# Patient Record
Sex: Male | Born: 1955 | Race: White | Hispanic: No | Marital: Married | Smoking: Former smoker
Health system: Southern US, Community
[De-identification: ages and names within clinical notes are randomized; demographics above are authoritative.]

## PROBLEM LIST (undated history)

## (undated) DIAGNOSIS — N182 Chronic kidney disease, stage 2 (mild): Secondary | ICD-10-CM

## (undated) DIAGNOSIS — Z860101 Personal history of adenomatous and serrated colon polyps: Secondary | ICD-10-CM

## (undated) DIAGNOSIS — E7800 Pure hypercholesterolemia, unspecified: Secondary | ICD-10-CM

## (undated) DIAGNOSIS — Z86010 Personal history of colonic polyps: Secondary | ICD-10-CM

## (undated) DIAGNOSIS — N2889 Other specified disorders of kidney and ureter: Secondary | ICD-10-CM

## (undated) DIAGNOSIS — M4802 Spinal stenosis, cervical region: Secondary | ICD-10-CM

## (undated) DIAGNOSIS — G5600 Carpal tunnel syndrome, unspecified upper limb: Secondary | ICD-10-CM

## (undated) DIAGNOSIS — K5909 Other constipation: Secondary | ICD-10-CM

## (undated) HISTORY — DX: Carpal tunnel syndrome, unspecified upper limb: G56.00

## (undated) HISTORY — PX: HAND SURGERY: SHX662

## (undated) HISTORY — DX: Spinal stenosis, cervical region: M48.02

## (undated) HISTORY — PX: COLONOSCOPY: SHX174

## (undated) HISTORY — DX: Personal history of adenomatous and serrated colon polyps: Z86.0101

## (undated) HISTORY — DX: Other constipation: K59.09

## (undated) HISTORY — PX: DENTAL SURGERY: SHX609

## (undated) HISTORY — DX: Chronic kidney disease, stage 2 (mild): N18.2

## (undated) HISTORY — DX: Other specified disorders of kidney and ureter: N28.89

## (undated) HISTORY — DX: Personal history of colonic polyps: Z86.010

---

## 2006-01-18 ENCOUNTER — Ambulatory Visit: Payer: Self-pay | Admitting: Internal Medicine

## 2007-06-28 ENCOUNTER — Telehealth (INDEPENDENT_AMBULATORY_CARE_PROVIDER_SITE_OTHER): Payer: Self-pay | Admitting: *Deleted

## 2007-06-29 ENCOUNTER — Ambulatory Visit: Payer: Self-pay | Admitting: Internal Medicine

## 2007-07-05 ENCOUNTER — Telehealth (INDEPENDENT_AMBULATORY_CARE_PROVIDER_SITE_OTHER): Payer: Self-pay | Admitting: *Deleted

## 2008-11-27 ENCOUNTER — Ambulatory Visit: Payer: Self-pay | Admitting: Internal Medicine

## 2008-11-27 DIAGNOSIS — E785 Hyperlipidemia, unspecified: Secondary | ICD-10-CM

## 2008-11-27 DIAGNOSIS — D485 Neoplasm of uncertain behavior of skin: Secondary | ICD-10-CM

## 2008-12-03 ENCOUNTER — Telehealth (INDEPENDENT_AMBULATORY_CARE_PROVIDER_SITE_OTHER): Payer: Self-pay | Admitting: *Deleted

## 2008-12-03 LAB — CONVERTED CEMR LAB
ALT: 28 units/L (ref 0–53)
Basophils Absolute: 0 10*3/uL (ref 0.0–0.1)
Chloride: 110 meq/L (ref 96–112)
Eosinophils Absolute: 0.1 10*3/uL (ref 0.0–0.7)
Glucose, Bld: 176 mg/dL — ABNORMAL HIGH (ref 70–99)
Hgb A1c MFr Bld: 7.4 % — ABNORMAL HIGH (ref 4.6–6.5)
Lymphocytes Relative: 20.6 % (ref 12.0–46.0)
MCV: 86.6 fL (ref 78.0–100.0)
Microalb Creat Ratio: 3.5 mg/g (ref 0.0–30.0)
Microalb, Ur: 1.1 mg/dL (ref 0.0–1.9)
Monocytes Absolute: 0.4 10*3/uL (ref 0.1–1.0)
PSA: 0.44 ng/mL (ref 0.10–4.00)
Platelets: 268 10*3/uL (ref 150.0–400.0)
RBC: 5.12 M/uL (ref 4.22–5.81)
RDW: 12.4 % (ref 11.5–14.6)
TSH: 1.95 microintl units/mL (ref 0.35–5.50)
Total Protein: 6.9 g/dL (ref 6.0–8.3)
WBC: 6.4 10*3/uL (ref 4.5–10.5)

## 2008-12-10 ENCOUNTER — Ambulatory Visit: Payer: Self-pay | Admitting: Internal Medicine

## 2008-12-10 DIAGNOSIS — E1165 Type 2 diabetes mellitus with hyperglycemia: Secondary | ICD-10-CM

## 2008-12-19 ENCOUNTER — Encounter: Payer: Self-pay | Admitting: Internal Medicine

## 2009-03-06 ENCOUNTER — Ambulatory Visit: Payer: Self-pay | Admitting: Internal Medicine

## 2009-03-18 ENCOUNTER — Encounter (INDEPENDENT_AMBULATORY_CARE_PROVIDER_SITE_OTHER): Payer: Self-pay | Admitting: *Deleted

## 2009-03-18 LAB — CONVERTED CEMR LAB
AST: 25 units/L (ref 0–37)
Albumin: 3.8 g/dL (ref 3.5–5.2)
Alkaline Phosphatase: 44 units/L (ref 39–117)
Hgb A1c MFr Bld: 6.5 % (ref 4.6–6.5)
Total CHOL/HDL Ratio: 5
Total Protein: 6.7 g/dL (ref 6.0–8.3)
VLDL: 35.2 mg/dL (ref 0.0–40.0)

## 2009-07-07 ENCOUNTER — Ambulatory Visit: Payer: Self-pay | Admitting: Internal Medicine

## 2009-07-07 DIAGNOSIS — R21 Rash and other nonspecific skin eruption: Secondary | ICD-10-CM

## 2010-04-02 ENCOUNTER — Ambulatory Visit: Payer: Self-pay | Admitting: Internal Medicine

## 2010-04-10 LAB — CONVERTED CEMR LAB
Alkaline Phosphatase: 39 units/L (ref 39–117)
Cholesterol: 183 mg/dL (ref 0–200)
Direct LDL: 99.7 mg/dL
Hgb A1c MFr Bld: 8.5 % — ABNORMAL HIGH (ref 4.6–6.5)
Microalb, Ur: 6.2 mg/dL — ABNORMAL HIGH (ref 0.0–1.9)

## 2010-05-19 ENCOUNTER — Ambulatory Visit: Payer: Self-pay | Admitting: Internal Medicine

## 2010-10-06 NOTE — Assessment & Plan Note (Signed)
Summary: FOLLOW UP PER LETTER/RH......   Vital Signs:  Patient profile:   55 year old male Height:      73.5 inches Weight:      240 pounds Temp:     98.0 degrees F oral Pulse rate:   80 / minute Resp:     17 per minute BP sitting:   110 / 90  (left arm)  Vitals Entered By: Jeremy Johann CMA (May 19, 2010 3:32 PM)  CC:  Type 2 diabetes mellitus follow-up.  History of Present Illness: Type 2 Diabetes Mellitus Follow-Up      This is a 55 year old man who presents for Type 2 diabetes mellitus follow-up.  The patient denies polyuria, polydipsia, blurred vision, self managed hypoglycemia, weight loss, weight gain, and numbness of extremities.  The patient denies the following symptoms: neuropathic pain, chest pain, vomiting, orthostatic symptoms, poor wound healing, intermittent claudication, vision loss, and foot ulcer.  Since the last visit the patient reports poor to fair  dietary compliance, noncompliance with medications ( he had had run out & didn't restart meds), exercising regularly, and not monitoring blood glucose.  Since the last visit, the patient reports having had no  eye care by an ophthalmologist ( appt later this month) and no foot care.  A1c was 11.5% on life insurance exam in 01/2010 ; that prompted him to restart meds.Risks  related to A1c level & role of HFCS in raising TG discussed. Hyperlipidemia Follow-Up      The patient also presents for Hyperlipidemia follow-up.  The patient reports chronic  constipation, but denies muscle aches, GI upset, abdominal pain, flushing, itching, diarrhea, and fatigue.  The patient denies the following symptoms: chest pain/pressure, exercise intolerance, dypsnea, palpitations, syncope, and pedal edema.  Compliance with medications (by patient report) has been near 100%.  Adjunctive measures currently used by the patient include ASA.  LDL is @ goal .  Current Medications (verified): 1)  Metformin Hcl 500 Mg Xr24h-Tab (Metformin Hcl)  .Marland Kitchen.. 1 Two Times A Day With Meals 2)  Pravachol 40 Mg Tabs (Pravastatin Sodium) .Marland Kitchen.. 1 At Bedtime  Allergies (verified): No Known Drug Allergies  Physical Exam  General:  well-nourished,in no acute distress; alert,appropriate and cooperative throughout examination Lungs:  Normal respiratory effort, chest expands symmetrically. Lungs are clear to auscultation, no crackles or wheezes. Heart:  Normal rate and regular rhythm. S1 and S2 normal without gallop, murmur, click, rub S4 Pulses:  R and L carotid,radial  and posterior tibial pulses are full and equal bilaterally. Decreased DPP  Extremities:  No clubbing, cyanosis, edema, or deformity noted . Good nail health Neurologic:  alert & oriented X3 and sensation intact to light touch over feet   Skin:  Intact without suspicious lesions or rashes Psych:  memory intact for recent and remote, normally interactive, and good eye contact.     Impression & Recommendations:  Problem # 1:  DIAB W/O MENTION COMP TYPE II/UNS TYPE UNCNTRL (ICD-250.02)  The following medications were removed from the medication list:    Metformin Hcl 500 Mg Xr24h-tab (Metformin hcl) .Marland Kitchen... 1 two times a day with meals His updated medication list for this problem includes:    Janumet 50-1000 Mg Tabs (Sitagliptin-metformin hcl) .Marland Kitchen... 1 two times a day with 2 largest meals  Problem # 2:  HYPERLIPIDEMIA (ICD-272.4)  His updated medication list for this problem includes:    Pravachol 40 Mg Tabs (Pravastatin sodium) .Marland Kitchen... 1 at bedtime  Complete Medication List: 1)  Pravachol 40 Mg Tabs (Pravastatin sodium) .Marland Kitchen.. 1 at bedtime 2)  Janumet 50-1000 Mg Tabs (Sitagliptin-metformin hcl) .Marland Kitchen.. 1 two times a day with 2 largest meals  Patient Instructions: 1)  Consume < 40 grams of HFCS sugar / day. 2)  Please schedule a follow-up appointment in 3 months. 3)  BUN,creat,K+ prior to visit, ICD-9:995.20 4)  Lipid Panel prior to visit, ICD-9:272.4 5)  HbgA1C prior to visit,  ICD-9:250.02 6)  Urine Microalbumin prior to visit, ICD-9:250.02. 7)  Take an 81 mg coated Aspirin every day. 8)  Check your blood sugars regularly. Your goals are fasting sugar 90-150 & < 180 (ideally < 160) 2 hrs after largest meal. 9)  See your eye doctor yearly to check for diabetic eye damage. 10)  Check your feet each night for sore areas, calluses or signs of infection. Prescriptions: JANUMET 50-1000 MG TABS (SITAGLIPTIN-METFORMIN HCL) 1 two times a day with 2 largest meals  #60 x 3   Entered and Authorized by:   Marga Melnick MD   Signed by:   Marga Melnick MD on 05/19/2010   Method used:   Print then Give to Patient   RxID:   [id number] PRAVACHOL 40 MG TABS (PRAVASTATIN SODIUM) 1 at bedtime  #90 x 3   Entered and Authorized by:   Marga Melnick MD   Signed by:   Marga Melnick MD on 05/19/2010   Method used:   Print then Give to Patient   RxID:   [id number]

## 2010-10-23 ENCOUNTER — Telehealth (INDEPENDENT_AMBULATORY_CARE_PROVIDER_SITE_OTHER): Payer: Self-pay | Admitting: *Deleted

## 2010-10-23 ENCOUNTER — Ambulatory Visit (INDEPENDENT_AMBULATORY_CARE_PROVIDER_SITE_OTHER): Payer: No Typology Code available for payment source | Admitting: Internal Medicine

## 2010-10-23 ENCOUNTER — Encounter: Payer: Self-pay | Admitting: Internal Medicine

## 2010-10-23 ENCOUNTER — Other Ambulatory Visit: Payer: Self-pay | Admitting: Internal Medicine

## 2010-10-23 DIAGNOSIS — E785 Hyperlipidemia, unspecified: Secondary | ICD-10-CM

## 2010-10-23 DIAGNOSIS — E1165 Type 2 diabetes mellitus with hyperglycemia: Secondary | ICD-10-CM

## 2010-10-23 DIAGNOSIS — IMO0001 Reserved for inherently not codable concepts without codable children: Secondary | ICD-10-CM

## 2010-10-23 LAB — HEMOGLOBIN A1C: Hgb A1c MFr Bld: 8.1 % — ABNORMAL HIGH (ref 4.6–6.5)

## 2010-10-23 LAB — CREATININE, SERUM: Creatinine, Ser: 1.2 mg/dL (ref 0.4–1.5)

## 2010-10-23 LAB — URIC ACID: Uric Acid, Serum: 6.2 mg/dL (ref 4.0–7.8)

## 2010-10-23 LAB — MICROALBUMIN / CREATININE URINE RATIO: Microalb Creat Ratio: 0.4 mg/g (ref 0.0–30.0)

## 2010-10-23 LAB — LIPID PANEL
Cholesterol: 202 mg/dL — ABNORMAL HIGH (ref 0–200)
HDL: 44.2 mg/dL (ref >39.00)

## 2010-10-23 LAB — BUN: BUN: 14 mg/dL (ref 6–23)

## 2010-10-28 NOTE — Progress Notes (Signed)
Summary: ? lancett  Phone Note Refill Request Call back at Home Phone [phone] [facility]   Message from:  Patient  patient has 1 touch ultra mini monitor -rx was sent in for lancet - he said [facility] [place] told him he would need a new monitor because they can"t order lancett for that monitor any more - he needs rx for monitor  Initial call taken by: Okey Regal [place],  October 23, 2010 1:31 PM  Follow-up for Phone Call        I spoke with the patient and he will come by th office to get everything concerning his blood glucose machine in order  Follow-up by: Shonna Chock CMA,  October 23, 2010 3:39 PM  Additional Follow-up for Phone Call Additional follow up Details #1::        Patient stopped by and picked up new meter Additional Follow-up by: Shonna Chock CMA,  October 23, 2010 4:58 PM

## 2010-10-28 NOTE — Assessment & Plan Note (Signed)
Summary: discuss blood sugar/cbs   Vital Signs:  Patient profile:   55 year old male Weight:      241.4 pounds BMI:     31.53 Temp:     98.4 degrees F oral Pulse rate:   84 / minute Resp:     14 per minute BP sitting:   124 / 86  (left arm) Cuff size:   large  Vitals Entered By: Shonna Chock CMA (October 23, 2010 10:21 AM) CC: Discuss Bloodsugars, Type 2 diabetes mellitus follow-up   CC:  Discuss Bloodsugars and Type 2 diabetes mellitus follow-up.  History of Present Illness:    Glenn Arnold has noted  glucoses 200 one hr post lunch. He  reports blurred vision (glucose was 300), but denies polyuria, polydipsia, weight loss, weight gain, and numbness of extremities.  The patient denies the following symptoms: neuropathic pain, chest pain, vomiting, orthostatic symptoms, poor wound healing, intermittent claudication, vision loss, and foot ulcer.  Since the last visit the patient reports fair  dietary compliance and exercising regularly.  A1c was 8.5% ( average sugar 197 & risk 70% increased) & TG 282 in 03/2010.  Current Medications (verified): 1)  Pravachol 40 Mg Tabs (Pravastatin Sodium) .Marland Kitchen.. 1 At Bedtime 2)  Janumet 50-1000 Mg Tabs (Sitagliptin-Metformin Hcl) .Marland Kitchen.. 1 Two Times A Day With 2 Largest Meals  Allergies (verified): No Known Drug Allergies  Physical Exam  General:  in no acute distress; alert,appropriate and cooperative throughout examination Heart:  Normal rate and regular rhythm. S1 and S2 normal without gallop, murmur, click, rub .S4 Pulses:  R and L carotid,radial,dorsalis pedis and posterior tibial pulses are full and equal bilaterally Extremities:  No clubbing, cyanosis, edema. Nails trimmedclosely Neurologic:  alert & oriented X3 and sensation intact to light touch over feet.   Cervical Nodes:  No lymphadenopathy noted Psych:  normally interactive focused, good eye contact.     Impression & Recommendations:  Problem # 1:  DIAB W/O MENTION COMP TYPE II/UNS  TYPE UNCNTRL (ICD-250.02)  His updated medication list for this problem includes:    Janumet 50-1000 Mg Tabs (Sitagliptin-metformin hcl) .Marland Kitchen... 1 two times a day with 2 largest meals  Orders: Venipuncture (94854) TLB-Creatinine, Blood (82565-CREA) TLB-Potassium (K+) (84132-K) TLB-Uric Acid, Blood (84550-URIC) TLB-BUN (Urea Nitrogen) (84520-BUN) TLB-A1C / Hgb A1C (Glycohemoglobin) (83036-A1C) TLB-Microalbumin/Creat Ratio, Urine (82043-MALB)  Problem # 2:  HYPERLIPIDEMIA (ICD-272.4) Assessment: Unchanged  His updated medication list for this problem includes:    Pravachol 40 Mg Tabs (Pravastatin sodium) .Marland Kitchen... 1 at bedtime  Orders: Venipuncture (62703) TLB-Lipid Panel (80061-LIPID) TLB-Uric Acid, Blood (84550-URIC)  Complete Medication List: 1)  Pravachol 40 Mg Tabs (Pravastatin sodium) .Marland Kitchen.. 1 at bedtime 2)  Janumet 50-1000 Mg Tabs (Sitagliptin-metformin hcl) .Marland Kitchen.. 1 two times a day with 2 largest meals  Patient Instructions: 1)  Follow The New Sugar [employer]usters; avoid HFCS sugar as discussed. 2)  Check your blood sugars regularly. If your am , fasting  readings are usually above :150  or below 90  OR glucose  TWO hrs after largest meal > 180 you should contact our office. 3)  See your eye doctor yearly to check for diabetic eye damage. 4)  Check your feet each night for sore areas, calluses or signs of infection.   Orders Added: 1)  Est. Patient Level III [50093] 2)  Venipuncture [81829] 3)  TLB-Lipid Panel [80061-LIPID] 4)  TLB-Creatinine, Blood [82565-CREA] 5)  TLB-Potassium (K+) [84132-K] 6)  TLB-Uric Acid, Blood [84550-URIC] 7)  TLB-BUN (Urea Nitrogen) [  84520-BUN] 8)  TLB-A1C / Hgb A1C (Glycohemoglobin) [83036-A1C] 9)  TLB-Microalbumin/Creat Ratio, Urine [82043-MALB]  Appended Document: discuss blood sugar/cbs Prescriptions: ONETOUCH DELICA LANCETS  MISC (LANCETS) check bloodsugars once daily  #100 x 3   Entered by:   Shonna Chock CMA   Authorized by:   Marga Melnick  MD   Signed by:   Shonna Chock CMA on 10/23/2010   Method used:   Electronically to        [facility] [address] (retail)       [phone]       [place], [place]  [address]       Ph: [phone]       Fax: [phone]   RxID:   [id number] ONETOUCH ULTRA BLUE  STRP (GLUCOSE BLOOD) check bloodsugar once daily  #100 x 3   Entered by:   Shonna Chock CMA   Authorized by:   Marga Melnick MD   Signed by:   Shonna Chock CMA on 10/23/2010   Method used:   Electronically to        [facility] [address] (retail)       [phone]       [place], [place]  [address]       Ph: [phone]       Fax: [phone]   RxID:   [id number]

## 2011-01-20 ENCOUNTER — Telehealth: Payer: Self-pay | Admitting: Internal Medicine

## 2011-01-20 NOTE — Telephone Encounter (Signed)
Patient has lab appt for 02/02/2011  ---I found these orders:  Lipid ,Creatinine,Potassium (K+) ,Uric Acid, BUN, Hgb A1C, (Glycohemoglobin), Microalbumin/Creat Ratio, Urine ---what codes do you want??    Any additions or corrections??

## 2011-01-21 NOTE — Telephone Encounter (Signed)
Codes: 272.4, 790.29, 790.6.

## 2011-01-21 NOTE — Telephone Encounter (Signed)
Added info to labs for 5/29

## 2011-01-22 NOTE — Assessment & Plan Note (Signed)
[facility]                                 ON-CALL NOTE   NAME:SHOCKLEYBubber, Rothert                       MRN:          [id number]  DATE:07/05/2007                            DOB:          August 28, 1956    TIME OF CALL:  6:34 p.m.   The caller is Leann as [facility].  We do not know who his  primary care physician is.   TELEPHONE NUMBER:  [phone].   The pharmacist received a fax from the [facility] Triage Nurse  about 5 o'clock this evening for 5 days of Avelox 400 mg to take once  daily, also promethazine with codeine syrup to use as needed.  She can  not read the name of the person who ordered this on the fax and calls me  to ask if I can okay it.  My response is no, I can not okay this  prescription without knowing what is going on.  I advised the pharmacist  to contact the [facility] office first thing tomorrow morning to  get this clarified.     Tera Mater. Clent Ridges, MD  Electronically Signed    SAF/MedQ  DD: 07/05/2007  DT: 07/06/2007  Job #: [id number]

## 2011-01-22 NOTE — Assessment & Plan Note (Signed)
[facility]                                 ON-CALL NOTE   NAME:SHOCKLEYAmdrew, Oboyle                       MRN:          [id number]  DATE:07/05/2007                            DOB:          1956/01/22    Time received is 6:09 p.m.  The patient is Glenn Arnold.  The caller  is the same.  He sees Dr. Alwyn Ren.  Date of birth [birth year].  Telephone [phone].  The patient is calling with a question about  antibiotics.  He says he contacted Dr. Frederik Pear office this afternoon,  and was to have had an antibiotic called in today for a sinus infection.  He says the pharmacy now says that they never received any such call.  He wonders if I could help him out.  My response is no, he would need to  contact Dr. Alwyn Ren tomorrow morning.     Tera Mater. Clent Ridges, MD  Electronically Signed    SAF/MedQ  DD: 07/05/2007  DT: 07/06/2007  Job #: [id number]

## 2011-01-29 ENCOUNTER — Other Ambulatory Visit: Payer: Self-pay | Admitting: *Deleted

## 2011-01-29 DIAGNOSIS — R7989 Other specified abnormal findings of blood chemistry: Secondary | ICD-10-CM

## 2011-01-29 DIAGNOSIS — E785 Hyperlipidemia, unspecified: Secondary | ICD-10-CM

## 2011-01-29 DIAGNOSIS — R7309 Other abnormal glucose: Secondary | ICD-10-CM

## 2011-02-02 ENCOUNTER — Other Ambulatory Visit (INDEPENDENT_AMBULATORY_CARE_PROVIDER_SITE_OTHER): Payer: No Typology Code available for payment source

## 2011-02-02 ENCOUNTER — Other Ambulatory Visit (INDEPENDENT_AMBULATORY_CARE_PROVIDER_SITE_OTHER): Payer: No Typology Code available for payment source | Admitting: Internal Medicine

## 2011-02-02 DIAGNOSIS — Z13220 Encounter for screening for lipoid disorders: Secondary | ICD-10-CM

## 2011-02-02 DIAGNOSIS — E785 Hyperlipidemia, unspecified: Secondary | ICD-10-CM

## 2011-02-02 DIAGNOSIS — R7309 Other abnormal glucose: Secondary | ICD-10-CM

## 2011-02-02 DIAGNOSIS — R7989 Other specified abnormal findings of blood chemistry: Secondary | ICD-10-CM

## 2011-02-02 LAB — LDL CHOLESTEROL, DIRECT: Direct LDL: 107.1 mg/dL

## 2011-02-02 LAB — LIPID PANEL
HDL: 43.2 mg/dL (ref >39.00)
Total CHOL/HDL Ratio: 4
Triglycerides: 242 mg/dL — ABNORMAL HIGH (ref 0.0–149.0)
VLDL: 48.4 mg/dL — ABNORMAL HIGH (ref 0.0–40.0)

## 2011-02-02 LAB — CREATININE, SERUM: Creatinine, Ser: 1.2 mg/dL (ref 0.4–1.5)

## 2011-02-02 LAB — MICROALBUMIN / CREATININE URINE RATIO: Microalb Creat Ratio: 1.3 mg/g (ref 0.0–30.0)

## 2011-02-09 NOTE — Progress Notes (Signed)
Labs only

## 2011-02-11 ENCOUNTER — Telehealth: Payer: Self-pay | Admitting: Internal Medicine

## 2011-02-11 NOTE — Progress Notes (Signed)
LMOM to call and schedule followup appt in order to continue getting meds refilled

## 2011-02-12 ENCOUNTER — Encounter: Payer: Self-pay | Admitting: Internal Medicine

## 2011-02-12 ENCOUNTER — Ambulatory Visit (INDEPENDENT_AMBULATORY_CARE_PROVIDER_SITE_OTHER): Payer: No Typology Code available for payment source | Admitting: Internal Medicine

## 2011-02-12 DIAGNOSIS — E785 Hyperlipidemia, unspecified: Secondary | ICD-10-CM

## 2011-02-12 DIAGNOSIS — R030 Elevated blood-pressure reading, without diagnosis of hypertension: Secondary | ICD-10-CM | POA: Insufficient documentation

## 2011-02-12 MED ORDER — SIMVASTATIN 40 MG PO TABS: 40.0000 mg | ORAL_TABLET | Freq: Every day | ORAL | Status: DC

## 2011-02-12 MED ORDER — GLIMEPIRIDE 1 MG PO TABS: 1.0000 mg | ORAL_TABLET | Freq: Every day | ORAL | Status: DC

## 2011-02-12 MED ORDER — RAMIPRIL 2.5 MG PO CAPS: 2.5000 mg | ORAL_CAPSULE | Freq: Every day | ORAL | Status: DC

## 2011-02-12 MED ORDER — SITAGLIPTIN PHOS-METFORMIN HCL 50-1000 MG PO TABS: 1.0000 | ORAL_TABLET | Freq: Two times a day (BID) | ORAL | Status: DC

## 2011-02-12 NOTE — Patient Instructions (Signed)
Diabetes Monitor    The A1c test is used primarily to monitor the glucose control of diabetics over time. The goal of those with diabetes is to keep their blood glucose levels as close to normal as possible. This helps to minimize the complications caused by chronically elevated glucose levels, such as progressive damage to body organs like the kidneys, eyes, cardiovascular system, and nerves. The A1c test gives a picture of the average amount of glucose in the blood over the last few months. It can help a patient and his doctor know if the measures they are taking to control the patient's diabetes are successful or need to be adjusted.    NORMAL VALUES  Non diabetic adults: 5 %-6.1%  Good diabetic control: 6.2-6.4 %  Fair diabetic control: 6.5-7%  Poor diabetic control: greater than 7 % ( except with additional factors such as  advanced age; significant coronary or neurologic disease,etc). Check the A1c  every 4 months as it is  6.5% or higher. Goals for home glucose monitoring are : fasting  or morning glucose goal of  90-150. Two hours after any meal , goal = < 180, preferably < 150.  The most common cause of elevated triglycerides is the ingestion of sugar from high fructose corn syrup sources. You should consume less than 40 grams  of sugar per day from foods and drinks with high fructose corn syrup as number 2, 3, or #4 on the label. As TG go up, HDL or good cholesterol goes down. Also uric acid which causes gout will go up.

## 2011-02-12 NOTE — Progress Notes (Signed)
Subjective:    Patient ID: Glenn Arnold, male    DOB: 05-May-1956, 55 y.o.   MRN: [id number]  HPI Diabetes status assessment: Fasting or morning glucose range:  73-131 or average : in 120s  . Highest glucose 2 hours after any meal:  < 180. Hypoglycemia :  In 70s  Two hrs after meal .                                                     Excess thirst :no;  Excess hunger:  no ;  Excess urination:  no.                                  Lightheadedness with standing:  no. Chest pain:  no ; Palpitations :no ;  Pain in  calves with walking:  no .                                                                                                                                 Non healing skin  ulcers or sores,especially over the feet:  no Numbness or tingling or burning in feet : no .                                                                                                                                              Significant change in  Weight : down 5#. Vision changes : no  .                                                                    Exercise : every other walking 1 mpd . Nutrition/diet:  Eats out . Medication compliance : yes. Medication adverse  Effects:  no . Eye exam : up to date. Foot care : no.  A1c/ urine microalbumin monitor:  Results reviewed & risks discussed; DM  control improved       Review of Systems     Objective:   Physical Exam Gen.: Healthy and well-nourished in appearance. Alert, appropriate and cooperative throughout exam. Eyes: No corneal or conjunctival inflammation noted. Neck: No deformities, masses, or tenderness noted.  Thyroid  normal. Lungs: Normal respiratory effort; chest expands symmetrically. Lungs are clear to auscultation without rales, wheezes, or increased work of breathing. Heart: Normal rate and rhythm. Normal S1 and S2. No gallop, click, or rub. S4 with slurring; murmur. No clubbing, cyanosis, edema, or deformity noted.Nail health   good. Vascular: Carotid, radial artery, dorsalis pedis and dorsalis posterior tibial pulses are full and equal. No bruits present. Neurologic: Alert and oriented x3. Light touch normal over feet. Skin: Intact without suspicious lesions or rashes. Psych: Mood and affect are normal. Normally interactive                                                                                         Assessment & Plan:  #1 DM , control improved  #2 dyslipidemia Plan: no med change; major focus on restricting HFCS sugar ( discussed in detail)

## 2011-02-17 NOTE — Telephone Encounter (Signed)
Was seen on 6/8 to review labs

## 2011-04-12 ENCOUNTER — Encounter: Payer: Self-pay | Admitting: Internal Medicine

## 2011-04-12 ENCOUNTER — Ambulatory Visit (INDEPENDENT_AMBULATORY_CARE_PROVIDER_SITE_OTHER): Payer: No Typology Code available for payment source | Admitting: Internal Medicine

## 2011-04-12 DIAGNOSIS — K5900 Constipation, unspecified: Secondary | ICD-10-CM

## 2011-04-12 DIAGNOSIS — R109 Unspecified abdominal pain: Secondary | ICD-10-CM

## 2011-04-12 MED ORDER — POLYETHYLENE GLYCOL 3350 17 G PO PACK: 17.0000 g | PACK | Freq: Every day | ORAL | Status: AC

## 2011-04-12 NOTE — Patient Instructions (Signed)
Stay on clear liquids for 48-72 hours or until bowels are normal.This would include  jello, sherbert (NOT ice cream), Lipton's chicken noodle soup(NOT cream based soups),Gatorade Lite, flat Ginger ale (without High Fructose Corn Syrup),dry toast or crackers, baked potato.No milk , dairy or grease until bowels are formed. Align , a [employer] , daily if stools are loose.  Report increasing pain, fever or rectal bleeding .  Take the stool softener every day; MiraLax daily for 3 days if needed.

## 2011-04-12 NOTE — Progress Notes (Signed)
  Subjective:    Patient ID: Glenn Arnold, male    DOB: Jan 17, 1956, 55 y.o.   MRN: [id number]  HPI Constipation: Onset: 7/22    Time course: scant stool every other day   Description:loose   Previous treatment: yes, salads & fruit. Stool softener ; Fleet's enema twice a day X 3 days; Glycerin suppository X3 Vomiting: no  Abdominal Pain: yes, mid abdominal , aching to sharp  Weight loss: yes, 7#  Decreased urine output: no  Lightheadedness: yes, 2-3 days ago  Recent travel history: yes, [place] 7/18-7/27    Suspicious food or water: yes  Change in diet: yes  Red Flags Fever: no  Bloody stools: no, but rectal irritation PMH: He was hospitalized in 1995 for severe abdominal pain in the context of protracted constipation. He believes that Dulcolax and other stimulant laxatives had caused this problem. He was found to be dehydrated at that time.  FH: mother : intermittent constipation  His last colonoscopy in 2004 was normal. DM: FBS < 120; checked irregularly  The last thyroid function test on record was in 2010.    Review of Systems     Objective:   Physical Exam General appearance is one of good health and nourishment w/o distress.  Eyes: No conjunctival inflammation or scleral icterus is present. EOMI; no lid lag  Oral exam: Dental hygiene is good; lips and gums are healthy appearing.There is no oropharyngeal erythema or exudate noted.   Heart:  Normal rate and regular rhythm. S1 and S2 normal without gallop, murmur, click, rub or other extra sounds     Lungs:Chest clear to auscultation; no wheezes, rhonchi,rales ,or rubs present.No increased work of breathing.   Abdomen: bowel sounds normal, soft and  Slightly tender midabdomen without masses, organomegaly or hernias noted.  No guarding or rebound   Skin:Warm & dry.  Intact without suspicious lesions or rashes ; no jaundice or tenting Neuro:DTRs WNL; oriented X 3  Lymphatic: No lymphadenopathy is noted about the  head, neck, axilla, or inguinal areas.  Rectal: declined             Assessment & Plan:  #1 constipation, severe, in the context of foreign travel  #2 abdominal discomfort secondary to #1; clinically no ileus  #3 diabetes, good control based on limited fasting blood sugar readings.  Plan: See orders and recommendations.

## 2011-04-13 LAB — CBC WITH DIFFERENTIAL/PLATELET
Basophils Absolute: 0 10*3/uL (ref 0.0–0.1)
Basophils Relative: 0.4 % (ref 0.0–3.0)
Eosinophils Absolute: 0.1 10*3/uL (ref 0.0–0.7)
Hemoglobin: 15.5 g/dL (ref 13.0–17.0)
Lymphs Abs: 1.8 10*3/uL (ref 0.7–4.0)
MCHC: 33.4 g/dL (ref 30.0–36.0)
MCV: 87.2 fl (ref 78.0–100.0)
Monocytes Absolute: 0.5 10*3/uL (ref 0.1–1.0)
Neutro Abs: 3.9 10*3/uL (ref 1.4–7.7)
RBC: 5.32 Mil/uL (ref 4.22–5.81)
RDW: 13.6 % (ref 11.5–14.6)

## 2011-04-13 LAB — BASIC METABOLIC PANEL
BUN: 12 mg/dL (ref 6–23)
Chloride: 106 mEq/L (ref 96–112)
Creatinine, Ser: 1.1 mg/dL (ref 0.4–1.5)
Glucose, Bld: 138 mg/dL — ABNORMAL HIGH (ref 70–99)
Potassium: 5.5 mEq/L — ABNORMAL HIGH (ref 3.5–5.1)

## 2011-05-11 ENCOUNTER — Other Ambulatory Visit: Payer: Self-pay | Admitting: Internal Medicine

## 2011-09-07 DIAGNOSIS — G5600 Carpal tunnel syndrome, unspecified upper limb: Secondary | ICD-10-CM

## 2011-09-07 HISTORY — PX: OTHER SURGICAL HISTORY: SHX169

## 2011-09-07 HISTORY — DX: Carpal tunnel syndrome, unspecified upper limb: G56.00

## 2011-09-21 ENCOUNTER — Telehealth: Payer: Self-pay | Admitting: Internal Medicine

## 2011-09-21 MED ORDER — OSELTAMIVIR PHOSPHATE 75 MG PO CAPS: 75.0000 mg | ORAL_CAPSULE | Freq: Two times a day (BID) | ORAL | Status: AC

## 2011-09-21 NOTE — Telephone Encounter (Signed)
Per Dr hopper tamiflu 1 bid #10.Rx sent to pharmacy left Pt detail message.

## 2011-09-21 NOTE — Telephone Encounter (Signed)
Patient states that he is now sick with what his wife has. Dr. Alwyn Ren called in tamiflu for his wife. He wants to know if Dr. Alwyn Ren will call in Tamiflu for him. [facility] [address].

## 2011-09-29 ENCOUNTER — Other Ambulatory Visit: Payer: Self-pay | Admitting: Internal Medicine

## 2011-09-30 ENCOUNTER — Other Ambulatory Visit: Payer: Self-pay | Admitting: Internal Medicine

## 2011-09-30 NOTE — Telephone Encounter (Signed)
A1C 250.00 

## 2011-10-02 ENCOUNTER — Other Ambulatory Visit: Payer: Self-pay | Admitting: Internal Medicine

## 2011-11-08 ENCOUNTER — Other Ambulatory Visit: Payer: Self-pay | Admitting: Internal Medicine

## 2011-11-08 NOTE — Telephone Encounter (Signed)
Prescription sent to pharmacy.

## 2011-11-22 ENCOUNTER — Emergency Department (HOSPITAL_BASED_OUTPATIENT_CLINIC_OR_DEPARTMENT_OTHER)
Admission: EM | Admit: 2011-11-22 | Discharge: 2011-11-22 | Disposition: A | Discharge status: Home / Self Care | Payer: No Typology Code available for payment source | Attending: Emergency Medicine | Admitting: Emergency Medicine

## 2011-11-22 ENCOUNTER — Encounter (HOSPITAL_BASED_OUTPATIENT_CLINIC_OR_DEPARTMENT_OTHER): Payer: Self-pay | Admitting: *Deleted

## 2011-11-22 DIAGNOSIS — Z7982 Long term (current) use of aspirin: Secondary | ICD-10-CM | POA: Insufficient documentation

## 2011-11-22 DIAGNOSIS — M62838 Other muscle spasm: Secondary | ICD-10-CM | POA: Insufficient documentation

## 2011-11-22 DIAGNOSIS — E119 Type 2 diabetes mellitus without complications: Secondary | ICD-10-CM | POA: Insufficient documentation

## 2011-11-22 DIAGNOSIS — Z87891 Personal history of nicotine dependence: Secondary | ICD-10-CM | POA: Insufficient documentation

## 2011-11-22 DIAGNOSIS — Z79899 Other long term (current) drug therapy: Secondary | ICD-10-CM | POA: Insufficient documentation

## 2011-11-22 DIAGNOSIS — E7800 Pure hypercholesterolemia, unspecified: Secondary | ICD-10-CM | POA: Insufficient documentation

## 2011-11-22 HISTORY — DX: Pure hypercholesterolemia, unspecified: E78.00

## 2011-11-22 MED ORDER — CYCLOBENZAPRINE HCL 5 MG PO TABS: 5.0000 mg | ORAL_TABLET | Freq: Three times a day (TID) | ORAL | Status: AC | PRN

## 2011-11-22 MED ORDER — OXYCODONE-ACETAMINOPHEN 5-325 MG PO TABS: 1.0000 | ORAL_TABLET | Freq: Four times a day (QID) | ORAL | Status: AC | PRN

## 2011-11-22 NOTE — ED Provider Notes (Signed)
History    This chart was scribed for Ethelda Chick, MD, MD by Smitty Pluck. The patient was seen in room MH06 and the patient's care was started at 3:49PM.   CSN: [id number]  Arrival date & time 11/22/11  1422   First MD Initiated Contact with Patient 11/22/11 1534      Chief Complaint  Patient presents with  . Neck Pain    (Consider location/radiation/quality/duration/timing/severity/associated sxs/prior treatment) Patient is a 56 y.o. male presenting with neck pain. The history is provided by the patient.  Neck Pain    Glenn Arnold is a 56 y.o. male who presents to the Emergency Department complaining of moderate neck pain radiating to right side of neck onset 1 week ago. The pain has been constant since onset. 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 Pt states that no position is comfortable. He has tried Advil and Aleeve with no relief. He denies trauma and injury to neck. He thinks that he may have slept wrong. He denies weakness in arms bilaterally, and fever.  There are no other associated systemic symptoms, there are no other alleviating or modifying factors.  PCP is Dr. Alwyn Ren   Past Medical History  Diagnosis Date  . Diabetes mellitus   . High cholesterol     Past Surgical History  Procedure Date  . Hand surgery   . Dental surgery     No family history on file.  History  Substance Use Topics  . Smoking status: Former Games developer  . Smokeless tobacco: Not on file  . Alcohol Use: No      Review of Systems  HENT: Positive for neck pain.   All other systems reviewed and are negative.   10 Systems reviewed and are negative for acute change except as noted in the HPI.  Allergies  Review of patient's allergies indicates no known allergies.  Home Medications   Current Outpatient Rx  Name Route Sig Dispense Refill  . ASPIRIN 81 MG PO TABS Oral Take 81 mg by mouth daily.        Marland Kitchen GLIMEPIRIDE 1 MG PO TABS  TAKE ONE TABLET BY MOUTH DAILY BEFORE BREAKFAST 90 tablet 0    **LABS DUE**  . JANUMET 50-1000 MG PO TABS  TAKE ONE TABLET BY MOUTH TWICE DAILY WITH TWO LARGEST MEALS 60 each 6  . RAMIPRIL 2.5 MG PO CAPS  TAKE ONE CAPSULE BY MOUTH EVERY DAY 90 capsule 2  . SIMVASTATIN 40 MG PO TABS  TAKE ONE TABLET BY MOUTH AT BEDTIME 90 tablet 1  . CYCLOBENZAPRINE HCL 5 MG PO TABS Oral Take 1 tablet (5 mg total) by mouth 3 (three) times daily as needed for muscle spasms. 20 tablet 0  . GLUCOSE BLOOD VI STRP Other 1 each by Other route. Check blood sugar once daily       . ONETOUCH DELICA LANCETS MISC Does not apply by Does not apply route. Check blood sugar once daily     . OXYCODONE-ACETAMINOPHEN 5-325 MG PO TABS Oral Take 1-2 tablets by mouth every 6 (six) hours as needed for pain. 15 tablet 0    BP 138/106  Pulse 81  Temp(Src) 97.6 F (36.4 C) (Oral)  Resp 20  Ht 6\' 3"  (1.905 m)  Wt 235 lb (106.595 kg)  BMI 29.37 kg/m2  SpO2 99% Vitals reviewed Physical Exam  Nursing note and vitals reviewed. Constitutional: He  is oriented to person, place, and time. He appears well-developed and well-nourished. No distress.  HENT:  Head: Normocephalic and atraumatic.  Eyes: Conjunctivae are normal. Pupils are equal, round, and reactive to light.  Neck: No thyromegaly present.  Cardiovascular: Normal rate, regular rhythm, normal heart sounds and intact distal pulses.   Pulmonary/Chest: Effort normal and breath sounds normal. No respiratory distress.  Abdominal: Soft. He exhibits no distension.  Neurological: He is alert and oriented to person, place, and time.  Skin: Skin is warm and dry.  Psychiatric: He has a normal mood and affect. His behavior is normal.  Neck- no midline tenderness, area of point tenderness over right paraspinous muscles on right, FROM, no nucha rigidity Ext- 2+ radial pulses, no clubbing/cyanosis or edema Neuro- strength 5/5 in extremities, senstaion  intact  ED Course  Procedures (including critical care time) DIAGNOSTIC STUDIES: Oxygen Saturation is 99% on room air, normal by my interpretation.    COORDINATION OF CARE: 3:57PM EDP discusses pt ED treatment course with pt    Labs Reviewed - No data to display No results found.   1. Cervical paraspinous muscle spasm       MDM  Pt presenting with pain in right side of neck radiating into right trapezius distribution, no trauma or fever and exam c/w musculoskeletal pain.  Pt advised to add pain meds and muscle relaxer to nsaids, he will arrange for follow up with PMD if pain continues.  Discharged with strict return precautions.  Pt agreeable with this plan.   I personally performed the services described in this documentation, which was scribed in my presence. The recorded information has been reviewed and considered.       Ethelda Chick, MD 11/22/11 [phone]

## 2011-11-22 NOTE — Discharge Instructions (Signed)
Return to the ED with any concerns including fever, weakness of arm, difficulty swallowing, or any other alarming symptoms

## 2011-11-22 NOTE — ED Notes (Signed)
Pain in the right side of his neck radiating down his right shoulder. 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

## 2011-11-23 ENCOUNTER — Ambulatory Visit: Payer: No Typology Code available for payment source | Admitting: Family

## 2011-11-30 ENCOUNTER — Ambulatory Visit (INDEPENDENT_AMBULATORY_CARE_PROVIDER_SITE_OTHER): Payer: No Typology Code available for payment source | Admitting: Internal Medicine

## 2011-11-30 ENCOUNTER — Encounter: Payer: Self-pay | Admitting: Internal Medicine

## 2011-11-30 VITALS — BP 132/90 | HR 72 | Temp 98.2°F | Wt 234.0 lb

## 2011-11-30 DIAGNOSIS — M5412 Radiculopathy, cervical region: Secondary | ICD-10-CM

## 2011-11-30 MED ORDER — GABAPENTIN 100 MG PO CAPS: 100.0000 mg | ORAL_CAPSULE | Freq: Three times a day (TID) | ORAL | Status: AC

## 2011-11-30 MED ORDER — CELECOXIB 200 MG PO CAPS: 200.0000 mg | ORAL_CAPSULE | Freq: Two times a day (BID) | ORAL | Status: DC

## 2011-11-30 NOTE — Patient Instructions (Signed)
Assess response to the gabapentin one every 8 hours as needed. If it is partially beneficial, it can be increased up to a total of 3 pills every 8 hours as needed. This increase of 1 pill each dose  should take place over 72 hours at least. 

## 2011-11-30 NOTE — Progress Notes (Signed)
  Subjective:    Patient ID: Glenn Arnold, male    DOB: May 18, 1956, 56 y.o.   MRN: [id number]  HPI He was seen at the [facility] 11/22/11 with cervicomyalgia. The pain did respond to narcotics and Flexeril but this caused profound sedation , gastric  & urinary dysfunction. These adverse symptoms have resolved off the medication but Miralax was necessary.  Aleve and Advil have not been of benefit since he stopped the narcotics.    Review of Systems NECK PAIN: Onset: 3 & 1/2 weeks ago an hour after awakening Location: mid posterior neck   Severity: up to 8 Pain is described as: dull constantly ; intermittent sharp pain  Worse with: no exacerbating factor  Better with: occasionally with position change Pain radiates to: R shoulder blade   Impaired range of motion: no  History of repetitive motion: no  History of overuse or hyperextension: no  History of trauma: no   Past history of similar problem: similar episode in his 30s; resolved with NSAIDS (Anaprox).  Symptoms Back Pain: some LBP over past 3 days Numbness/tingling:  no Weakness: no  Red Flags Fever:  no   Fasting blood sugars have averaged approximately 115. He denies polyphagia, polydipsia, or polyuria. He's had no numbness or tingling in the hands or feet   Headache: no  Bowel/bladder dysfunction:  no       Objective:   Physical Exam Gen.: Healthy and well-nourished in appearance. Alert, appropriate and cooperative throughout exam. Head: Normocephalic without obvious abnormalities  Eyes: No corneal or conjunctival inflammation noted. Pupils equal round reactive to light and accommodation. FOV WNL. Extraocular motion intact. Vision grossly normal.  Mouth: Oral mucosa and oropharynx reveal no lesions or exudates. Teeth in good repair ; upper & lower implants. Neck: No deformities, masses, or tenderness noted. Range of motion normal but pain in posterior neck  with flexion Lungs: Normal respiratory effort;  chest expands symmetrically. Lungs are clear to auscultation without rales, wheezes, or increased work of breathing. Heart: Normal rate and rhythm. Normal S1 and S2. No gallop, click, or rub.S 4 w/o murmur. Abdomen: Bowel sounds normal; abdomen soft and nontender. No masses, organomegaly or hernias noted.  Musculoskeletal/extremities: No deformity or scoliosis noted of  the thoracic or lumbar spine. No clubbing, cyanosis, edema, or deformity noted. Range of motion  normal .Tone & strength  normal.Joints normal. Fingernails deformed post trauma.Neg SLR Vascular: Carotid, radial artery, dorsalis pedis and  posterior tibial pulses are full and equal. No bruits present. Neurologic: Alert and oriented x3. Deep tendon reflexes symmetrical and normal.          Skin: Intact without suspicious lesions or rashes. Lymph: No cervical, axillary lymphadenopathy present. Psych: Mood and affect are normal. Normally interactive                                                                                         Assessment & Plan:  #1 cervical radiculopathy; probable C5-6 on the right. Excessive sedation and GI/GU adverse effects with narcotics. MRI indicated if not contraindicated by oral implants.

## 2011-11-30 NOTE — Assessment & Plan Note (Signed)
Fasting blood sugars averaging 115 without hypoglycemia.  Last A1c was 6.7% on 04/12/11

## 2011-12-07 ENCOUNTER — Telehealth: Payer: Self-pay | Admitting: Internal Medicine

## 2011-12-07 ENCOUNTER — Ambulatory Visit (HOSPITAL_BASED_OUTPATIENT_CLINIC_OR_DEPARTMENT_OTHER)
Admission: RE | Admit: 2011-12-07 | Discharge: 2011-12-07 | Disposition: A | Discharge status: Home / Self Care | Payer: No Typology Code available for payment source | Source: Ambulatory Visit | Attending: Internal Medicine | Admitting: Internal Medicine

## 2011-12-07 ENCOUNTER — Encounter: Payer: Self-pay | Admitting: Internal Medicine

## 2011-12-07 DIAGNOSIS — M542 Cervicalgia: Secondary | ICD-10-CM | POA: Insufficient documentation

## 2011-12-07 DIAGNOSIS — M5412 Radiculopathy, cervical region: Secondary | ICD-10-CM

## 2011-12-07 DIAGNOSIS — M5380 Other specified dorsopathies, site unspecified: Secondary | ICD-10-CM | POA: Insufficient documentation

## 2011-12-07 DIAGNOSIS — R0609 Other forms of dyspnea: Secondary | ICD-10-CM

## 2011-12-07 DIAGNOSIS — M4802 Spinal stenosis, cervical region: Secondary | ICD-10-CM | POA: Insufficient documentation

## 2011-12-07 NOTE — Telephone Encounter (Signed)
Dr.Hopper please advise 

## 2011-12-07 NOTE — Telephone Encounter (Signed)
Patient calling, had his MRI C spine today.  He would like the results as soon as possible, as he is very uncomfortable with the pain.  Also, patient states he is out of the Celebrex 200mg .

## 2011-12-08 ENCOUNTER — Other Ambulatory Visit: Payer: Self-pay | Admitting: Internal Medicine

## 2011-12-08 DIAGNOSIS — M5412 Radiculopathy, cervical region: Secondary | ICD-10-CM

## 2011-12-08 MED ORDER — CELECOXIB 200 MG PO CAPS: 200.0000 mg | ORAL_CAPSULE | Freq: Two times a day (BID) | ORAL | Status: AC

## 2011-12-08 NOTE — Telephone Encounter (Signed)
Patient calling back, he doesn't want to go home today until he hears from our office that he can pick up rx for more Celebrex.  Also, patient wants referral to neurosurgery, preferences of [facility], or [facility].

## 2011-12-28 ENCOUNTER — Other Ambulatory Visit: Payer: Self-pay | Admitting: Neurosurgery

## 2011-12-28 DIAGNOSIS — M542 Cervicalgia: Secondary | ICD-10-CM

## 2011-12-28 DIAGNOSIS — M4800 Spinal stenosis, site unspecified: Secondary | ICD-10-CM

## 2011-12-28 DIAGNOSIS — M5410 Radiculopathy, site unspecified: Secondary | ICD-10-CM

## 2011-12-29 ENCOUNTER — Ambulatory Visit
Admission: RE | Admit: 2011-12-29 | Discharge: 2011-12-29 | Disposition: A | Discharge status: Home / Self Care | Payer: No Typology Code available for payment source | Source: Ambulatory Visit | Attending: Neurosurgery | Admitting: Neurosurgery

## 2011-12-29 VITALS — BP 128/76 | HR 69

## 2011-12-29 DIAGNOSIS — M5410 Radiculopathy, site unspecified: Secondary | ICD-10-CM

## 2011-12-29 DIAGNOSIS — M542 Cervicalgia: Secondary | ICD-10-CM

## 2011-12-29 DIAGNOSIS — M4800 Spinal stenosis, site unspecified: Secondary | ICD-10-CM

## 2011-12-29 MED ORDER — IOHEXOL 300 MG/ML  SOLN
9.0000 mL | Freq: Once | INTRAMUSCULAR | Status: AC | PRN
  Administered 2011-12-29: 9 mL via INTRATHECAL

## 2011-12-29 MED ORDER — DIAZEPAM 5 MG PO TABS
10.0000 mg | ORAL_TABLET | Freq: Once | ORAL | Status: AC
  Administered 2011-12-29: 10 mg via ORAL

## 2011-12-29 NOTE — Discharge Instructions (Signed)

## 2012-01-24 ENCOUNTER — Other Ambulatory Visit: Payer: Self-pay | Admitting: Internal Medicine

## 2012-01-24 NOTE — Telephone Encounter (Signed)
A1C 250.00 

## 2012-02-09 ENCOUNTER — Other Ambulatory Visit: Payer: Self-pay | Admitting: Internal Medicine

## 2012-02-09 NOTE — Telephone Encounter (Signed)
Looking for surgical clear paperwork, not located in the building, spoke wt/Chrae and patient has not had an EKG in a while so he does need to make an appointment before hopper will fill out paperwork

## 2012-02-10 ENCOUNTER — Encounter: Payer: Self-pay | Admitting: Internal Medicine

## 2012-02-10 ENCOUNTER — Ambulatory Visit (INDEPENDENT_AMBULATORY_CARE_PROVIDER_SITE_OTHER): Payer: No Typology Code available for payment source | Admitting: Internal Medicine

## 2012-02-10 VITALS — BP 124/80 | HR 72 | Temp 97.9°F | Resp 12 | Ht 73.08 in | Wt 233.0 lb

## 2012-02-10 DIAGNOSIS — E785 Hyperlipidemia, unspecified: Secondary | ICD-10-CM

## 2012-02-10 DIAGNOSIS — IMO0001 Reserved for inherently not codable concepts without codable children: Secondary | ICD-10-CM

## 2012-02-10 DIAGNOSIS — Z01818 Encounter for other preprocedural examination: Secondary | ICD-10-CM

## 2012-02-10 DIAGNOSIS — R030 Elevated blood-pressure reading, without diagnosis of hypertension: Secondary | ICD-10-CM

## 2012-02-10 LAB — BASIC METABOLIC PANEL
BUN: 13 mg/dL (ref 6–23)
CO2: 29 mEq/L (ref 19–32)
Calcium: 9.4 mg/dL (ref 8.4–10.5)
Chloride: 106 mEq/L (ref 96–112)
Creatinine, Ser: 1 mg/dL (ref 0.4–1.5)
GFR: 86.13 mL/min (ref >60.00)
Glucose, Bld: 74 mg/dL (ref 70–99)
Potassium: 4.2 mEq/L (ref 3.5–5.1)
Sodium: 142 mEq/L (ref 135–145)

## 2012-02-10 LAB — HEMOGLOBIN A1C: Hgb A1c MFr Bld: 6.7 % — ABNORMAL HIGH (ref 4.6–6.5)

## 2012-02-10 LAB — MICROALBUMIN / CREATININE URINE RATIO
Creatinine,U: 144.5 mg/dL
Microalb Creat Ratio: 1.2 mg/g (ref 0.0–30.0)
Microalb, Ur: 1.7 mg/dL (ref 0.0–1.9)

## 2012-02-10 LAB — LIPID PANEL
Cholesterol: 163 mg/dL (ref 0–200)
LDL Cholesterol: 92 mg/dL (ref 0–99)
Triglycerides: 151 mg/dL — ABNORMAL HIGH (ref 0.0–149.0)
VLDL: 30.2 mg/dL (ref 0.0–40.0)

## 2012-02-10 LAB — ALT: ALT: 27 U/L (ref 0–53)

## 2012-02-10 NOTE — Patient Instructions (Addendum)
Please try to go on My Chart within the next 24 hours to allow me to release the results directly to you.  

## 2012-02-10 NOTE — Progress Notes (Signed)
Subjective:    Patient ID: Glenn Arnold, male    DOB: 10-05-1955, 56 y.o.   MRN: [id number]  HPI He is here for preoperative clearance for endoscopic laser surgery of the cervical spine later this month in [place], [place] at the [facility]. With Celebrex he's had decrease in  pain in the right shoulder. He was documented to have carpal tunnel syndrome in the right upper extremity on EMG/NCT. He continues to have intermittent pain in the left shoulder. In the last 3 weeks he's developed numbness in this area. He does not have significant numbness or tingling or weakness in his arms. He has had no incontinence of bowel or stool.    Review of Systems  #1) past medical history of elevated blood pressure without diagnosis of HYPERTENSION:attributed to uncontrolled pain with cervical issues Disease Monitoring: Blood pressure range-no monitor  Chest pain, palpitations- no       Dyspnea-no Lightheadedness,Syncope-no    Edema- no   #2)DIABETES: Disease Monitoring: Blood Sugar ranges-120-140  Polyuria/phagia/dipsia- no       Visual problems- no Medications: Compliance- yes  Hypoglycemic symptoms- rarely in 80s, 2-3 hrs post meal   #3) HYPERLIPIDEMIA: Medications: Compliance- yes  Abd pain, bowel changes-no   Muscle aches- no           Objective:   Physical Exam Gen.: Healthy and well-nourished in appearance. Alert, appropriate and cooperative throughout exam. Head: Normocephalic without obvious abnormalities  Eyes: No corneal or conjunctival inflammation noted.  Extraocular motion intact. Vision grossly normal with contacts.  Nose: External nasal exam reveals no deformity or inflammation. Nasal mucosa are pink and moist. No lesions or exudates noted.  Mouth: Oral mucosa and oropharynx reveal no lesions or exudates. Teeth in good repair (bridges). Neck: No deformities, masses, or tenderness noted. Range of motion& Thyroid normal Lungs: Normal respiratory effort; chest  expands symmetrically. Lungs are clear to auscultation without rales, wheezes, or increased work of breathing. Heart: Normal rate and rhythm. Normal S1 and S2. No gallop, click, or rub. S4 w/o murmur. Abdomen: Bowel sounds normal; abdomen soft and nontender. No masses, organomegaly or hernias noted.                                                                  Musculoskeletal/extremities: No deformity or scoliosis noted of  the thoracic or lumbar spine. No clubbing, cyanosis, edema, or deformity noted. Range of motion  normal .Tone & strength  normal.Joints normal. Nail health:minor toe nail changes. Vascular: Carotid, radial artery, dorsalis pedis and  posterior tibial pulses are full and equal. No bruits present. Neurologic: Alert and oriented x3. Deep tendon reflexes symmetrical and normal. Light touch normal over feet. Positive Tinel's sign suggested in the  left wrist         Skin: Intact without suspicious lesions or rashes. Lymph: No cervical, axillary lymphadenopathy  Psych: Mood and affect are normal. Normally interactive  Assessment & Plan:   #1 cervical stenosis, symptomatic  #2 diabetes; A1c was 7% on 11/30/11. Recheck needed.  #3 dyslipidemia; last lipids 02/02/11  #4 past history of elevated blood pressure without diagnosis of hypertension. This is most likely related to the uncontrolled pain in the context of cervical spine disease. EKG is normal without hypertensive changes  Plan: Clearance will be provided after review of pending labs

## 2012-02-28 HISTORY — PX: OTHER SURGICAL HISTORY: SHX169

## 2012-03-13 ENCOUNTER — Ambulatory Visit (INDEPENDENT_AMBULATORY_CARE_PROVIDER_SITE_OTHER): Payer: No Typology Code available for payment source | Admitting: Internal Medicine

## 2012-03-13 ENCOUNTER — Encounter: Payer: Self-pay | Admitting: Internal Medicine

## 2012-03-13 VITALS — BP 114/78 | HR 96 | Temp 98.3°F | Wt 232.8 lb

## 2012-03-13 DIAGNOSIS — Z9889 Other specified postprocedural states: Secondary | ICD-10-CM

## 2012-03-13 DIAGNOSIS — Z789 Other specified health status: Secondary | ICD-10-CM

## 2012-03-13 DIAGNOSIS — E119 Type 2 diabetes mellitus without complications: Secondary | ICD-10-CM

## 2012-03-13 DIAGNOSIS — Z87898 Personal history of other specified conditions: Secondary | ICD-10-CM

## 2012-03-13 NOTE — Patient Instructions (Addendum)
Report  Warning Signs as discussed. 

## 2012-03-13 NOTE — Progress Notes (Signed)
  Subjective:    Patient ID: Glenn Arnold, male    DOB: Jul 10, 1956, 55 y.o.   MRN: [id number]  HPI Following his cervical microsurgery 02/28/12 in [place], [place] he did have increased pain requiring increased amounts of narcotics. This has tapered with decreased intake of narcotic medications. His pain level was 8.5 on 7/5; pain level was 1 after adjusting activities as per instructions from the surgeon's office 7/5  He's had no complications from the surgery. Specifically he denies fever, chills, purulent secretions.    Review of Systems Because of sedation from the pain medications he is not been checking his glucoses diligently as preoperatively. His most recent fasting glucose was 126; A1c was 6.7% preoperatively 02/10/12.     Objective:   Physical Exam  He appears healthy and well-nourished and in no distress  He has no lymphadenopathy about the neck or axilla.  The Steri-Strips were removed from the posterior neck without difficulty. There is a 25 mm laceration which is closed and well-healed with no evidence of cellulitis or purulence.        Assessment & Plan:  #1 status post microsurgery of the cervical spine; no complications or sequelae.  #2 diabetes; control appears to be adequate  He is to report any signs of localized cellulitis or infection.  A1c should be checked in November or December.

## 2012-04-10 ENCOUNTER — Other Ambulatory Visit: Payer: Self-pay | Admitting: Internal Medicine

## 2012-04-10 NOTE — Telephone Encounter (Signed)
A1c 250.00 

## 2012-05-10 ENCOUNTER — Other Ambulatory Visit: Payer: Self-pay | Admitting: Internal Medicine

## 2012-06-13 ENCOUNTER — Other Ambulatory Visit: Payer: Self-pay | Admitting: Internal Medicine

## 2012-06-13 NOTE — Telephone Encounter (Signed)
A1C 250.00 

## 2012-07-13 ENCOUNTER — Other Ambulatory Visit: Payer: Self-pay | Admitting: Internal Medicine

## 2012-07-13 NOTE — Telephone Encounter (Signed)
Rx sent.    MW 

## 2012-08-21 ENCOUNTER — Other Ambulatory Visit: Payer: Self-pay | Admitting: Internal Medicine

## 2012-08-22 NOTE — Telephone Encounter (Signed)
A1C 250.00 

## 2012-09-27 ENCOUNTER — Other Ambulatory Visit: Payer: Self-pay | Admitting: Internal Medicine

## 2012-09-28 NOTE — Telephone Encounter (Signed)
A1C 250.00 

## 2012-10-21 ENCOUNTER — Other Ambulatory Visit: Payer: Self-pay

## 2012-10-25 ENCOUNTER — Encounter: Payer: Self-pay | Admitting: Lab

## 2012-10-26 ENCOUNTER — Other Ambulatory Visit (INDEPENDENT_AMBULATORY_CARE_PROVIDER_SITE_OTHER): Payer: No Typology Code available for payment source

## 2012-10-26 DIAGNOSIS — E1059 Type 1 diabetes mellitus with other circulatory complications: Secondary | ICD-10-CM

## 2012-11-06 ENCOUNTER — Other Ambulatory Visit: Payer: Self-pay | Admitting: Internal Medicine

## 2012-11-06 NOTE — Telephone Encounter (Signed)
Patient needs to schedule a follow-up.

## 2012-12-29 ENCOUNTER — Other Ambulatory Visit: Payer: Self-pay | Admitting: Internal Medicine

## 2013-01-16 ENCOUNTER — Ambulatory Visit: Payer: No Typology Code available for payment source | Admitting: Internal Medicine

## 2013-01-23 ENCOUNTER — Encounter: Payer: Self-pay | Admitting: Internal Medicine

## 2013-01-23 ENCOUNTER — Ambulatory Visit (INDEPENDENT_AMBULATORY_CARE_PROVIDER_SITE_OTHER): Payer: No Typology Code available for payment source | Admitting: Internal Medicine

## 2013-01-23 VITALS — BP 116/78 | HR 73 | Wt 235.0 lb

## 2013-01-23 DIAGNOSIS — IMO0001 Reserved for inherently not codable concepts without codable children: Secondary | ICD-10-CM

## 2013-01-23 MED ORDER — SITAGLIPTIN PHOS-METFORMIN HCL 50-1000 MG PO TABS: ORAL_TABLET | ORAL | Status: DC

## 2013-01-23 NOTE — Patient Instructions (Addendum)
Eat a low-fat diet with lots of fruits and vegetables, up to 7-9 servings per day. Consume less than 40   Grams (preferably ZERO) of sugar per day from foods & drinks with High Fructose Corn Syrup (HFCS) sugar as #1,2,3 or # 4 on label.Whole Foods, [employer] & [employer] do not carry products with HFCS. Follow a  low carb nutrition program such as [place] or [employer]  to prevent Diabetes progression . White carbohydrates (potatoes, rice, bread, and pasta) have a high spike of sugar and a high load of sugar. For example a  baked potato has a cup of sugar and a  french fry  2 teaspoons of sugar. Yams, wild  rice, whole grained bread &  wheat pasta have been much lower spike and load of  sugar. Portions should be the size of a deck of cards or your palm. Check glucose once daily if possible Fasting or morning glucose recommended M, W, F, & Sun if possible. Goal= 100-150 Glucose 2 hours after breakfast Tues, after lunch Thurs & 2 hrs after eve meal Sat if possible. Goal = < 180, ideally < 160. Please  schedule fasting Labs in 12 weeks after nutrition changes: Lipids, AST,AST, TSH, A1c, urine microscopic albumin. PLEASE BRING THESE INSTRUCTIONS TO FOLLOW UP  LAB APPOINTMENT.This will guarantee correct labs are drawn, eliminating need for repeat blood sampling ( needle sticks ! ). Diagnoses /Codes: 250.00, 272.4

## 2013-01-23 NOTE — Progress Notes (Signed)
Subjective:    Patient ID: Glenn Arnold, male    DOB: 1955/11/09, 57 y.o.   MRN: [id number]  HPI Diabetes status assessment: 10/26/12 A1C was 6.8%  Pt has not checked blood sugar in last 2-3 months Fasting or morning glucose range /average :  Pt does not check blood sugar Highest glucose 2 hours after any meal is:  Last checked 2-3 months ago, was 130. Hypoglycemia reported- one isolated event while traveling and medication was off schedule.                                                                                                                 No regular exercise routine.  Walks the dog 2-4 times per week, 10-15 minutes at a time. No specific nutrition/diet followed Medication compliance is good. No medication adverse effects noted. Eye exam current.  March 2014 Foot care not current  A1c 6.8%= average sugar 148 with long-term  36 %  risk     Review of Systems No excess thirst ;  excess hunger ; or excess urination reported                              No lightheadedness with standing reported No chest pain ; palpitations ; claudication described .                                                                                                                             No non healing skin  ulcers or sores of extremities noted. No numbness or tingling or burning in feet described                                                                                                                                             No significant change in weight of pound  gain pound loss.   No blurred,double, or loss of vision reported  .            Objective:   Physical Exam Gen.: Healthy  & well-nourished, appropriate and alert, weight not excess Eyes: No lid/conjunctival changes, extraocular motion intact Neck: Normal  thyroid  Respiratory: No increased work of breathing or abnormal breath sounds Cardiac : regular rhythm, no extra heart sounds, gallop, no murmur Abdomen: No  organomegaly ,masses, bruits or aortic enlargement Lymph: No lymphadenopathy of the neck or axilla Skin: No rashes, lesions, ulcers or ischemic changes Muscle skeletal: no nail changes; joints normal Vasc:All pulses intact, no bruits present Neuro: Normal deep tendon reflexes, alert & oriented, sensation over feet intact normal Psych: judgment and insight, mood and affect normal        Assessment & Plan:  See Current Assessment & Plan in Problem List under specific Diagnosis

## 2013-01-23 NOTE — Assessment & Plan Note (Signed)
   The potential risk associated with sulfonylurea therapy was discussed;specifically hypoglycemia, weight gain, and possible cardiac risk.  Focus will be on nutrition and exercise with repeat A1c with urine microalbumin and lipids in 4 months. If A1c continues to rise; Victoza should be considered

## 2013-02-08 ENCOUNTER — Other Ambulatory Visit: Payer: Self-pay | Admitting: Internal Medicine

## 2013-03-17 ENCOUNTER — Encounter (HOSPITAL_BASED_OUTPATIENT_CLINIC_OR_DEPARTMENT_OTHER): Payer: Self-pay | Admitting: *Deleted

## 2013-03-17 ENCOUNTER — Emergency Department (HOSPITAL_BASED_OUTPATIENT_CLINIC_OR_DEPARTMENT_OTHER)
Admission: EM | Admit: 2013-03-17 | Discharge: 2013-03-17 | Disposition: A | Discharge status: Home / Self Care | Payer: BC Managed Care – PPO | Attending: Emergency Medicine | Admitting: Emergency Medicine

## 2013-03-17 ENCOUNTER — Emergency Department (HOSPITAL_BASED_OUTPATIENT_CLINIC_OR_DEPARTMENT_OTHER): Payer: BC Managed Care – PPO

## 2013-03-17 DIAGNOSIS — R110 Nausea: Secondary | ICD-10-CM | POA: Insufficient documentation

## 2013-03-17 DIAGNOSIS — E119 Type 2 diabetes mellitus without complications: Secondary | ICD-10-CM | POA: Insufficient documentation

## 2013-03-17 DIAGNOSIS — S92501A Displaced unspecified fracture of right lesser toe(s), initial encounter for closed fracture: Secondary | ICD-10-CM

## 2013-03-17 DIAGNOSIS — Y929 Unspecified place or not applicable: Secondary | ICD-10-CM | POA: Insufficient documentation

## 2013-03-17 DIAGNOSIS — Z7982 Long term (current) use of aspirin: Secondary | ICD-10-CM | POA: Insufficient documentation

## 2013-03-17 DIAGNOSIS — Z8739 Personal history of other diseases of the musculoskeletal system and connective tissue: Secondary | ICD-10-CM | POA: Insufficient documentation

## 2013-03-17 DIAGNOSIS — Z8669 Personal history of other diseases of the nervous system and sense organs: Secondary | ICD-10-CM | POA: Insufficient documentation

## 2013-03-17 DIAGNOSIS — E7800 Pure hypercholesterolemia, unspecified: Secondary | ICD-10-CM | POA: Insufficient documentation

## 2013-03-17 DIAGNOSIS — S92919A Unspecified fracture of unspecified toe(s), initial encounter for closed fracture: Secondary | ICD-10-CM | POA: Insufficient documentation

## 2013-03-17 DIAGNOSIS — Z79899 Other long term (current) drug therapy: Secondary | ICD-10-CM | POA: Insufficient documentation

## 2013-03-17 DIAGNOSIS — IMO0002 Reserved for concepts with insufficient information to code with codable children: Secondary | ICD-10-CM | POA: Insufficient documentation

## 2013-03-17 DIAGNOSIS — Z87891 Personal history of nicotine dependence: Secondary | ICD-10-CM | POA: Insufficient documentation

## 2013-03-17 DIAGNOSIS — Y939 Activity, unspecified: Secondary | ICD-10-CM | POA: Insufficient documentation

## 2013-03-17 MED ORDER — HYDROCODONE-ACETAMINOPHEN 5-325 MG PO TABS: 1.0000 | ORAL_TABLET | ORAL | Status: DC | PRN

## 2013-03-17 NOTE — ED Notes (Signed)
Patient is resting comfortably. 

## 2013-03-17 NOTE — ED Notes (Signed)
Patient transported to X-ray via stretcher 

## 2013-03-17 NOTE — ED Notes (Signed)
Family at bedside.resting comfortably. No complaints at present. Will continue to monitor

## 2013-03-17 NOTE — ED Notes (Signed)
Family at bedside. 

## 2013-03-17 NOTE — ED Notes (Addendum)
Family at bedside. 

## 2013-03-17 NOTE — ED Notes (Signed)
Pt states he hit his right little toe on the couch about 45 min ago. Obvious def noted. ?dislocated.

## 2013-03-17 NOTE — ED Notes (Signed)
CBG 256. Shary Key RN notified.

## 2013-03-17 NOTE — ED Provider Notes (Signed)
History    CSN: [id number] Arrival date & time 03/17/13  1352  First MD Initiated Contact with Patient 03/17/13 1517     Chief Complaint  Patient presents with  . Toe Injury   (Consider location/radiation/quality/duration/timing/severity/associated sxs/prior Treatment) HPI Comments: Patient is a 57 year old male who presents today with right fifth toe pain after he kicked a couch. There is an obvious deformity in his toe. He reports his pain is only moderate and does not radiate. It is an achy pain. He has been ambulatory since the incident. He is mildly nauseous. He has never injured this toe before. PMH includes diabetes. No fevers, chills, vomiting, abdominal pain, paresthesias.  The history is provided by the patient. No language interpreter was used.   Past Medical History  Diagnosis Date  . Diabetes mellitus   . High cholesterol   . Cervical stenosis of spinal canal     Dr Jeral Fruit  . CTS (carpal tunnel syndrome) 2013    RUE > LUE   Past Surgical History  Procedure Laterality Date  . Hand surgery      post fracture R hand  . Dental surgery      implants ([employer])  . Colonoscopy  2008    negative  . Cervical myelogram  2013  . Microscopic cervical disc surgery  02/28/2012    Dr Birdie Riddle , [place]   Family History  Problem Relation Age of Onset  . Heart disease Father     congenital; S/P CBAG & valve replacement  . Diabetes Father   . Stroke Paternal Grandfather 7  . Diabetes Mother   . Cancer Sister     breast   History  Substance Use Topics  . Smoking status: Former Smoker    Quit date: 09/06/1974  . Smokeless tobacco: Not on file  . Alcohol Use: No    Review of Systems  Constitutional: Negative for fever and chills.  Gastrointestinal: Positive for nausea. Negative for vomiting.  Musculoskeletal: Positive for joint swelling, arthralgias and gait problem.  Neurological: Negative for weakness and numbness.  All other systems reviewed and are  negative.    Allergies  Review of patient's allergies indicates no known allergies.  Home Medications   Current Outpatient Rx  Name  Route  Sig  Dispense  Refill  . aspirin 81 MG tablet   Oral   Take 81 mg by mouth daily.           Marland Kitchen glucose blood (ONE TOUCH ULTRA TEST) test strip   Other   1 each by Other route. Check blood sugar once daily            . ONETOUCH DELICA LANCETS MISC   Does not apply   by Does not apply route. Check blood sugar once daily          . ramipril (ALTACE) 2.5 MG capsule      TAKE 1 TABLET BY MOUTH ONCE DAILY   30 capsule   6   . simvastatin (ZOCOR) 40 MG tablet      TAKE ONE TABLET BY MOUTH AT BEDTIME   30 tablet   4   . sitaGLIPtan-metformin (JANUMET) 50-1000 MG per tablet      OV DUE, 1 by mouth twice daily with largest meals   60 tablet   2   . sitaGLIPtan-metformin (JANUMET) 50-1000 MG per tablet      1 by mouth twice daily with largest meals   180 tablet  1    BP 133/93  Pulse 84  Temp(Src) 98.4 F (36.9 C) (Oral)  Resp 18  Ht 6\' 3"  (1.905 m)  Wt 230 lb (104.327 kg)  BMI 28.75 kg/m2  SpO2 100% Physical Exam  Nursing note and vitals reviewed. Constitutional: He is oriented to person, place, and time. He appears well-developed and well-nourished. No distress.  HENT:  Head: Normocephalic and atraumatic.  Right Ear: External ear normal.  Left Ear: External ear normal.  Nose: Nose normal.  Eyes: Conjunctivae are normal.  Neck: Normal range of motion. No tracheal deviation present.  Cardiovascular: Normal rate, regular rhythm and normal heart sounds.   Pulmonary/Chest: Effort normal and breath sounds normal. No stridor.  Abdominal: Soft. He exhibits no distension. There is no tenderness.  Musculoskeletal: Normal range of motion.  Obvious deformity of right 5th toe. TTP. Neurovascularly intact.   Neurological: He is alert and oriented to person, place, and time.  Skin: Skin is warm and dry. He is not  diaphoretic.  Psychiatric: He has a normal mood and affect. His behavior is normal.    ED Course  NERVE BLOCK Performed by: Mora Bellman Authorized by: Mora Bellman Consent: Verbal consent obtained. written consent not obtained. The procedure was performed in an emergent situation. Risks and benefits: risks, benefits and alternatives were discussed Consent given by: patient Patient understanding: patient states understanding of the procedure being performed Patient consent: the patient's understanding of the procedure matches consent given Required items: required blood products, implants, devices, and special equipment available Patient identity confirmed: verbally with patient and arm band Preparation: Patient was prepped and draped in the usual sterile fashion. Patient position: supine Needle gauge: 24 G Location technique: anatomical landmarks Local anesthetic: lidocaine 2% without epinephrine Anesthetic total: 2 ml Outcome: pain improved Patient tolerance: Patient tolerated the procedure well with no immediate complications. Comments: Digital block of right 5th toe   (including critical care time)    Labs Reviewed  GLUCOSE, CAPILLARY - Abnormal; Notable for the following:    Glucose-Capillary 256 (*)    All other components within normal limits   Dg Toe 5th Right  03/17/2013   *RADIOLOGY REPORT*  Clinical Data: Injury  RIGHT FIFTH TOE  Comparison: None  Findings: Fracture through the proximal fifth phalanx with comminution and moderate angulation.  No other fracture  IMPRESSION: Angulated fracture of the proximal fifth phalanx.   Original Report Authenticated By: Janeece Riggers, M.D.   1. Fracture of fifth toe, right, closed, initial encounter     MDM  Patient presents with angulated fracture of the proximal 5th phalanx. Digital block was performed and tolerated well. Dr. Ignacia Palma reduced fracture. XR post reduction shows angulated fracture. Dr. Jerl Santos viewed  XR. Dr. Jerl Santos personally spoke with patient and discussed that if anything more needs to be done it will be the placement of a pin. He is stable for discharge. Follow up with Dr. Jerl Santos and as outpatient.   Mora Bellman, PA-C 03/20/13 2028

## 2013-03-18 NOTE — Procedures (Signed)
Pt was given a digital nerve block with 2% lidocaine by Ms. Merrell.  When this had taken effect, I performed manipulative reduction of the fracture, with improved position of his toe, and buddy-taped the right fifth toe to the right 4th toe.  Re-x-ray showed improved position but persistant angulation of the fracture.  I contacted Marcene Corning, M.D., who advised that pt should have followup in his office in one week.  He spoke to the patient by telephone, advising him that if his toe was not healing in a satisfactory position by then, it would need reduction and internal fixation with a K-wire.

## 2013-03-19 ENCOUNTER — Other Ambulatory Visit: Payer: Self-pay | Admitting: Internal Medicine

## 2013-03-20 ENCOUNTER — Telehealth: Payer: Self-pay | Admitting: Internal Medicine

## 2013-03-20 ENCOUNTER — Telehealth: Payer: Self-pay | Admitting: *Deleted

## 2013-03-20 NOTE — Telephone Encounter (Signed)
Rx request received on 03-19-13. Pt due for labs. Marland Kitchen.Left Pt detail message lab appt due, Rx sent

## 2013-03-20 NOTE — Telephone Encounter (Signed)
Pt advise that last lipid done 02-10-12 so he is due for labs per refill protocol and a 30 day supply has been sent. Pt states that he does not want to have labs done now but would prefer to have all labs repeated in September when he is due to have A1C done. Per labs done on 10-26-12 A1C 6.8 so he is to have labs in 4 month which means he is now due for labl.When Pt was advise of this he rudely states that I will be in September to have all labs done as stated and if Alf[facility] Flavors is not will to fill med then he guess he will need to find another PCP. Please advise if you are will to continue to fill med until September for Pt.

## 2013-03-20 NOTE — Telephone Encounter (Signed)
Pt wanted to check to see if we received his cholesterol refill from his pharmacy. ([facility]). thanks

## 2013-03-20 NOTE — Telephone Encounter (Signed)
meds can be filled until fasting labs & F/U OV  in Sept

## 2013-03-21 MED ORDER — SIMVASTATIN 40 MG PO TABS: 40.0000 mg | ORAL_TABLET | Freq: Every day | ORAL | Status: DC

## 2013-03-21 NOTE — Telephone Encounter (Signed)
Left message to call office to advise Pt that med can be filled until fasting labs and F/U OV in Sept.

## 2013-03-22 NOTE — Telephone Encounter (Signed)
Left message to call office

## 2013-03-29 NOTE — ED Provider Notes (Signed)
Medical screening examination/treatment/procedure(s) were conducted as a shared visit with non-physician practitioner(s) and myself.  I personally evaluated the patient during the encounter Pt was given a digital nerve block with 2% lidocaine by Ms. Merrell. When this had taken effect, I performed manipulative reduction of the fracture, with improved position of his toe, and buddy-taped the right fifth toe to the right 4th toe. Re-x-ray showed improved position but persistant angulation of the fracture. I contacted Marcene Corning, M.D., who advised that pt should have followup in his office in one week. He spoke to the patient by telephone, advising him that if his toe was not healing in a satisfactory position by then, it would need reduction and internal fixation with a K-wire.       Carleene Cooper III, MD 03/29/13 1146

## 2013-04-19 ENCOUNTER — Ambulatory Visit: Payer: No Typology Code available for payment source | Admitting: Internal Medicine

## 2013-04-23 ENCOUNTER — Other Ambulatory Visit: Payer: Self-pay | Admitting: Internal Medicine

## 2013-05-25 ENCOUNTER — Telehealth: Payer: Self-pay | Admitting: Internal Medicine

## 2013-05-25 ENCOUNTER — Ambulatory Visit: Payer: No Typology Code available for payment source | Admitting: Internal Medicine

## 2013-05-25 DIAGNOSIS — E785 Hyperlipidemia, unspecified: Secondary | ICD-10-CM

## 2013-05-25 DIAGNOSIS — I10 Essential (primary) hypertension: Secondary | ICD-10-CM

## 2013-05-25 MED ORDER — SIMVASTATIN 40 MG PO TABS: 40.0000 mg | ORAL_TABLET | Freq: Every day | ORAL | Status: DC

## 2013-05-25 MED ORDER — RAMIPRIL 2.5 MG PO CAPS: ORAL_CAPSULE | ORAL | Status: DC

## 2013-05-25 NOTE — Telephone Encounter (Signed)
Refill for zocor and altace sent to [facility]

## 2013-05-25 NOTE — Telephone Encounter (Signed)
Patient had to reschedule his appointment today due to a conflict between his appointment time at 11am and a change in Dr. Frederik Pear schedule. He was unable to reschedule for a later time and is instead coming in next available day on Thursday, 10/25. Patient says he is out of two of his medications which he was coming to have filled Simvastatin and Ramipril. He is asking for a refill to hold him over until his appointment. Please advise.

## 2013-05-31 ENCOUNTER — Ambulatory Visit (INDEPENDENT_AMBULATORY_CARE_PROVIDER_SITE_OTHER): Payer: BC Managed Care – PPO | Admitting: Internal Medicine

## 2013-05-31 ENCOUNTER — Encounter: Payer: Self-pay | Admitting: Internal Medicine

## 2013-05-31 VITALS — BP 117/81 | HR 79 | Temp 98.2°F | Resp 12 | Wt 232.4 lb

## 2013-05-31 DIAGNOSIS — R030 Elevated blood-pressure reading, without diagnosis of hypertension: Secondary | ICD-10-CM

## 2013-05-31 DIAGNOSIS — E785 Hyperlipidemia, unspecified: Secondary | ICD-10-CM

## 2013-05-31 DIAGNOSIS — IMO0001 Reserved for inherently not codable concepts without codable children: Secondary | ICD-10-CM

## 2013-05-31 LAB — HEMOGLOBIN A1C: Hgb A1c MFr Bld: 8 % — ABNORMAL HIGH (ref 4.6–6.5)

## 2013-05-31 LAB — TSH: TSH: 1.45 u[IU]/mL (ref 0.35–5.50)

## 2013-05-31 LAB — HEPATIC FUNCTION PANEL
Alkaline Phosphatase: 27 U/L — ABNORMAL LOW (ref 39–117)
Bilirubin, Direct: 0.1 mg/dL (ref 0.0–0.3)
Total Bilirubin: 1.1 mg/dL (ref 0.3–1.2)

## 2013-05-31 LAB — BASIC METABOLIC PANEL
Calcium: 9 mg/dL (ref 8.4–10.5)
GFR: 80.85 mL/min (ref >60.00)
Glucose, Bld: 146 mg/dL — ABNORMAL HIGH (ref 70–99)
Potassium: 4.3 mEq/L (ref 3.5–5.1)
Sodium: 139 mEq/L (ref 135–145)

## 2013-05-31 LAB — LIPID PANEL
HDL: 43.1 mg/dL (ref >39.00)
LDL Cholesterol: 98 mg/dL (ref 0–99)
Total CHOL/HDL Ratio: 4
Triglycerides: 167 mg/dL — ABNORMAL HIGH (ref 0.0–149.0)
VLDL: 33.4 mg/dL (ref 0.0–40.0)

## 2013-05-31 LAB — MICROALBUMIN / CREATININE URINE RATIO
Creatinine,U: 277.8 mg/dL
Microalb Creat Ratio: 0.9 mg/g (ref 0.0–30.0)
Microalb, Ur: 2.4 mg/dL — ABNORMAL HIGH (ref 0.0–1.9)

## 2013-05-31 NOTE — Assessment & Plan Note (Signed)
Lipids,  LFT,TSH  

## 2013-05-31 NOTE — Assessment & Plan Note (Signed)
A1c & urine micralbumin

## 2013-05-31 NOTE — Assessment & Plan Note (Signed)
BP goals discussed BMET 

## 2013-05-31 NOTE — Progress Notes (Signed)
Subjective:    Patient ID: Glenn Arnold, male    DOB: [birth year], 57 y.o.   MRN: [id number]  HPI   He returns for followup of his diabetes; he has been off and also polyuria since May. He was discontinued because of the research suggesting possible long-term adverse cardiovascular effects. In February of this year his A1c was 6.8%.  His last lipids were in June 2013; they were at goal with an LDL of 92, HDL 40.6, and triglycerides of 151.  He is not  on a  heart healthy diet; he exercises 60 minutes 2-3 times per week as CVE without symptoms. Specifically he denies chest pain, palpitations, dyspnea, or claudication. Family history is negative for premature coronary disease. Advanced cholesterol testing reveals his LDL goal is less than 100, ideally <70. There is medication compliance with the statin. Significant abdominal symptoms, memory deficit, or myalgias denied. Diabetes status assessment:  Fasting or morning glucose is not monitored. No symptoms of hypoglycemia reported                                                                                                                 Medication compliance is good. No medication adverse effects noted. Eye exam current. Foot care not current     Review of Systems    No excess thirst ;  excess hunger ; or excess urination reported                              No lightheadedness with standing reported                                                                                                                            No non healing skin  ulcers or sores of extremities noted. No numbness or tingling or burning in feet described  No significant change in weight of 5 pound  loss. No blurred,double, or loss of vision reported  .          Objective:   Physical Exam Gen.: Healthy and  well-nourished in appearance. Alert, appropriate and cooperative throughout exam. Head: Normocephalic without obvious abnormalities Eyes: No corneal or conjunctival inflammation noted.  Nose: External nasal exam reveals no deformity or inflammation. Nasal mucosa are pink and moist. No lesions or exudates noted.   Mouth: Oral mucosa and oropharynx reveal no lesions or exudates. Teeth in good repair. Neck: No deformities, masses, or tenderness noted.  Thyroid normal. Lungs: Normal respiratory effort; chest expands symmetrically. Lungs are clear to auscultation without rales, wheezes, or increased work of breathing. Heart: Normal rate and rhythm. Normal S1 and S2. No gallop, click, or rub. S4 w/o murmur. Abdomen: Bowel sounds normal; abdomen soft and nontender. No masses, organomegaly or hernias noted.                                  Musculoskeletal/extremities: No deformity or scoliosis noted of  the thoracic or lumbar spine.  No clubbing, cyanosis, edema, or significant extremity  deformity noted. Tone & strength  Normal. 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 Nail health good. Able to lie down & sit up w/o help.  Vascular: Carotid, radial artery,  and  posterior tibial pulses are full and equal. No bruits present. But he has good hair growth on the toes and no evidence of ischemic skin changes. Neurologic: Alert and oriented x3. Deep tendon reflexes symmetrical and normal.  Light touch is normal over the feet     Skin: Intact without suspicious lesions or rashes. Lymph: No cervical, axillary lymphadenopathy present. Psych: Mood and affect are normal. Normally interactive                                                                                        Assessment & Plan:  See Current Assessment & Plan in Problem List under specific Diagnosis

## 2013-05-31 NOTE — Patient Instructions (Addendum)

## 2013-06-01 ENCOUNTER — Other Ambulatory Visit: Payer: Self-pay | Admitting: Internal Medicine

## 2013-06-01 MED ORDER — PIOGLITAZONE HCL 15 MG PO TABS: 15.0000 mg | ORAL_TABLET | Freq: Every day | ORAL | Status: DC

## 2013-07-12 ENCOUNTER — Other Ambulatory Visit: Payer: Self-pay

## 2013-10-02 ENCOUNTER — Other Ambulatory Visit: Payer: Self-pay | Admitting: Internal Medicine

## 2013-10-02 NOTE — Telephone Encounter (Signed)
Actos refilled per protocol. JG//CMA

## 2013-11-06 ENCOUNTER — Other Ambulatory Visit: Payer: Self-pay | Admitting: Internal Medicine

## 2014-02-12 ENCOUNTER — Other Ambulatory Visit: Payer: Self-pay | Admitting: Internal Medicine

## 2014-03-22 ENCOUNTER — Telehealth: Payer: Self-pay

## 2014-03-22 NOTE — Telephone Encounter (Signed)
LVM for pt to call back to schedule CPE with PCP

## 2014-04-17 ENCOUNTER — Other Ambulatory Visit: Payer: Self-pay | Admitting: Internal Medicine

## 2014-05-06 ENCOUNTER — Telehealth: Payer: Self-pay

## 2014-05-06 NOTE — Telephone Encounter (Signed)
Sending letter regarding DM bundle

## 2014-05-20 ENCOUNTER — Other Ambulatory Visit: Payer: Self-pay

## 2014-05-20 MED ORDER — RAMIPRIL 2.5 MG PO CAPS: ORAL_CAPSULE | ORAL | Status: DC

## 2014-06-17 ENCOUNTER — Ambulatory Visit (HOSPITAL_BASED_OUTPATIENT_CLINIC_OR_DEPARTMENT_OTHER)
Admission: RE | Admit: 2014-06-17 | Discharge: 2014-06-17 | Disposition: A | Discharge status: Home / Self Care | Payer: No Typology Code available for payment source | Source: Ambulatory Visit | Attending: Family Medicine | Admitting: Family Medicine

## 2014-06-17 ENCOUNTER — Encounter: Payer: Self-pay | Admitting: Family Medicine

## 2014-06-17 ENCOUNTER — Ambulatory Visit (INDEPENDENT_AMBULATORY_CARE_PROVIDER_SITE_OTHER): Payer: No Typology Code available for payment source | Admitting: Family Medicine

## 2014-06-17 VITALS — BP 126/85 | HR 74 | Temp 97.7°F | Resp 18 | Ht 73.0 in | Wt 239.0 lb

## 2014-06-17 DIAGNOSIS — E119 Type 2 diabetes mellitus without complications: Secondary | ICD-10-CM

## 2014-06-17 DIAGNOSIS — K5909 Other constipation: Secondary | ICD-10-CM

## 2014-06-17 DIAGNOSIS — K5900 Constipation, unspecified: Secondary | ICD-10-CM

## 2014-06-17 DIAGNOSIS — K589 Irritable bowel syndrome without diarrhea: Secondary | ICD-10-CM

## 2014-06-17 DIAGNOSIS — E785 Hyperlipidemia, unspecified: Secondary | ICD-10-CM

## 2014-06-17 DIAGNOSIS — R194 Change in bowel habit: Secondary | ICD-10-CM | POA: Diagnosis present

## 2014-06-17 DIAGNOSIS — Z1211 Encounter for screening for malignant neoplasm of colon: Secondary | ICD-10-CM | POA: Insufficient documentation

## 2014-06-17 LAB — MICROALBUMIN / CREATININE URINE RATIO
Creatinine,U: 59.9 mg/dL
MICROALB UR: 0.2 mg/dL (ref 0.0–1.9)
Microalb Creat Ratio: 0.3 mg/g (ref 0.0–30.0)

## 2014-06-17 LAB — CBC WITH DIFFERENTIAL/PLATELET
BASOS PCT: 0.2 % (ref 0.0–3.0)
Basophils Absolute: 0 10*3/uL (ref 0.0–0.1)
EOS PCT: 0.3 % (ref 0.0–5.0)
Eosinophils Absolute: 0 10*3/uL (ref 0.0–0.7)
HEMATOCRIT: 47 % (ref 39.0–52.0)
HEMOGLOBIN: 15.6 g/dL (ref 13.0–17.0)
LYMPHS ABS: 1.6 10*3/uL (ref 0.7–4.0)
Lymphocytes Relative: 20.9 % (ref 12.0–46.0)
MCHC: 33.3 g/dL (ref 30.0–36.0)
MCV: 86.6 fl (ref 78.0–100.0)
MONO ABS: 0.5 10*3/uL (ref 0.1–1.0)
Monocytes Relative: 6.3 % (ref 3.0–12.0)
NEUTROS PCT: 72.3 % (ref 43.0–77.0)
Neutro Abs: 5.6 10*3/uL (ref 1.4–7.7)
Platelets: 296 10*3/uL (ref 150.0–400.0)
RBC: 5.42 Mil/uL (ref 4.22–5.81)
RDW: 13.7 % (ref 11.5–15.5)
WBC: 7.8 10*3/uL (ref 4.0–10.5)

## 2014-06-17 LAB — COMPREHENSIVE METABOLIC PANEL
ALBUMIN: 3.8 g/dL (ref 3.5–5.2)
ALK PHOS: 29 U/L — AB (ref 39–117)
ALT: 21 U/L (ref 0–53)
AST: 19 U/L (ref 0–37)
BUN: 13 mg/dL (ref 6–23)
CO2: 27 mEq/L (ref 19–32)
Calcium: 9.6 mg/dL (ref 8.4–10.5)
Chloride: 101 mEq/L (ref 96–112)
Creatinine, Ser: 1.2 mg/dL (ref 0.4–1.5)
GFR: 68.65 mL/min (ref >60.00)
GLUCOSE: 169 mg/dL — AB (ref 70–99)
POTASSIUM: 4.4 meq/L (ref 3.5–5.1)
SODIUM: 134 meq/L — AB (ref 135–145)
TOTAL PROTEIN: 7.6 g/dL (ref 6.0–8.3)
Total Bilirubin: 0.9 mg/dL (ref 0.2–1.2)

## 2014-06-17 LAB — HEMOGLOBIN A1C: Hgb A1c MFr Bld: 7.3 % — ABNORMAL HIGH (ref 4.6–6.5)

## 2014-06-17 MED ORDER — LUBIPROSTONE 8 MCG PO CAPS: 8.0000 ug | ORAL_CAPSULE | Freq: Two times a day (BID) | ORAL | Status: DC

## 2014-06-17 MED ORDER — PIOGLITAZONE HCL 15 MG PO TABS: ORAL_TABLET | ORAL | Status: DC

## 2014-06-17 MED ORDER — SIMVASTATIN 40 MG PO TABS: ORAL_TABLET | ORAL | Status: DC

## 2014-06-17 MED ORDER — RAMIPRIL 2.5 MG PO CAPS: ORAL_CAPSULE | ORAL | Status: DC

## 2014-06-17 NOTE — Assessment & Plan Note (Signed)
Lab Results  Component Value Date   CHOL 174 05/31/2013   HDL 43.10 05/31/2013   LDLCALC 98 05/31/2013   LDLDIRECT 107.1 02/02/2011   TRIG 167.0* 05/31/2013   CHOLHDL 4 05/31/2013   Tolerating 40mg  simva well. Continue this.  Not fasting today. Will check fasting lipid panel at next f/u in 78mo.

## 2014-06-17 NOTE — Progress Notes (Signed)
Office Note 06/17/2014  CC:  Chief Complaint  Patient presents with  . Establish Care    HPI:  Glenn Arnold is a 58 y.o. White male who is here to establish/transfer care. Patient's most recent primary MD: Dr. Linna Darner, with [facility] (retiring). Old records in EP[facility] EMR were reviewed prior to or during today's visit. Last f/u visit with Dr. Linna Darner was over a year ago.  No routine DM monitoring.  No hypoglycemia.   Compliant with meds for the most part except janumet he may miss a few doses a week. He does not try to eat a heart-healthy diet. No exercise except when he walks his dog.  Relates worsening constipation the last 1 mo or so, associated with worsening LBP.  The back pain goes away when he has a good evacuation.  BM's are small, every 1-2 days, feels like it is building up and every 5-7 days he takes miralax and things clear up.  HA also comes with the constip---clears up with a good BM.  However, of late, he has less sensation of completely empty colon after BM.  No BRBPR.  Occ has black stool, not persistent.  Takes miralax OTC prn, no other laxative.  Probiotic 1 qd.  Historically, relates problems with constipation, question of IBS constipation type.  Used to fluctuate between constip and diarrhea in the remote past.    Past Medical History  Diagnosis Date  . Diabetes mellitus   . High cholesterol   . Cervical stenosis of spinal canal     Dr Joya Salm  . CTS (carpal tunnel syndrome) 2013    RUE > LUE  Chronic constipation  Past Surgical History  Procedure Laterality Date  . Hand surgery      post fracture R hand  . Dental surgery      implants ([employer])  . Colonoscopy  ? 2005-2007    Pt not sure of timing.  Says it was done in [place] and it was normal.  . Cervical myelogram  2013  . Microscopic cervical disc surgery  02/28/2012    Dr Audelia Hives , [place]    Family History  Problem Relation Age of Onset  . Heart disease Father    congenital; S/P CBAG & valve replacement  . Diabetes Father   . Stroke Paternal Grandfather 65  . Diabetes Mother   . Cancer Sister     breast    History   Social History  . Marital Status: Married    Spouse Name: N/A    Number of Children: N/A  . Years of Education: N/A   Occupational History  . Not on file.   Social History Main Topics  . Smoking status: Former Smoker    Quit date: 09/06/1974  . Smokeless tobacco: Not on file  . Alcohol Use: No  . Drug Use: No  . Sexual Activity: Not on file   Other Topics Concern  . Not on file   Social History Narrative   Married, 1 adult son.   Lives in [place].   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   No T/A/Ds.    Outpatient Encounter Prescriptions as of 06/17/2014  Medication Sig  . aspirin 81 MG tablet Take 81 mg by mouth daily.    Marland Kitchen JANUMET 50-1000 MG per tablet TAKE 1 TABLET BY MOUTH TWICE DAILY WITH LARGEST MEALS  . pioglitazone (ACTOS) 15 MG tablet TAKE ONE TABLET BY MOUTH ONE TIME DAILY  . ramipril (ALTACE) 2.5 MG capsule TAKE 1 CAPSULE BY  MOUTH ONCE DAILY  . [DISCONTINUED] pioglitazone (ACTOS) 15 MG tablet TAKE ONE TABLET BY MOUTH ONE TIME DAILY   . [DISCONTINUED] ramipril (ALTACE) 2.5 MG capsule TAKE 1 CAPSULE BY MOUTH ONCE DAILY  . [DISCONTINUED] simvastatin (ZOCOR) 40 MG tablet Take 1 tablet (40 mg total) by mouth at bedtime.  Marland Kitchen glucose blood (ONE TOUCH ULTRA TEST) test strip 1 each by Other route. Check blood sugar once daily     . lubiprostone (AMITIZA) 8 MCG capsule Take 1 capsule (8 mcg total) by mouth 2 (two) times daily with a meal.  . ONETOUCH DELICA LANCETS MISC by Does not apply route. Check blood sugar once daily   . simvastatin (ZOCOR) 40 MG tablet TAKE 1 TABLET BY MOUTH AT BEDTIME  . [DISCONTINUED] simvastatin (ZOCOR) 40 MG tablet TAKE 1 TABLET BY MOUTH AT BEDTIME    No Known Allergies  ROS As per HPI.  Also: no LE radicular pain or paresthesias, no LE weakness, no loss of B/B function, no saddle  anesthesia.    PE; Blood pressure 126/85, pulse 74, temperature 97.7 F (36.5 C), temperature source Oral, resp. rate 18, height 6\' 1"  (1.854 m), weight 239 lb (108.41 kg), SpO2 96.00%. Gen: Alert, well appearing.  Patient is oriented to person, place, time, and situation. RUE:AVWU: no injection, icteris, swelling, or exudate.  EOMI, PERRLA. Mouth: lips without lesion/swelling.  Oral mucosa pink and moist. Oropharynx without erythema, exudate, or swelling.  Neck - No masses or thyromegaly or limitation in range of motion CV: RRR, distant S1 and S2, no m/r/g.   LUNGS: CTA bilat, nonlabored resps, good aeration in all lung fields. ABD: soft, ND, BS hypoactive.  No HSM.  No mass or bruit.  He has mild TTP in RUQ and mild diffuse lower abd discomfort on palpation. EXT: no clubbing, cyanosis, or edema.   Pertinent labs:  None today  ASSESSMENT AND PLAN:   Transfer pt.  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 He is worse the last 1 month. Plan is to check KUB today to assess stool burden--currently he says he feels like he is NOT full of stool. Start daily senakot S generic OTC, 2 tabs qhs. Start amitiza 8 mcg bid.  May still use miralax on prn basis. We'll refer him to [facility] for ongoing GI follow up (he is about due for his 10 yr repeat colonoscopy+he has had a change in bowel habits over the last 1 mo). Check CBC and CMET today.  Diabetes mellitus without complication Hx of poor control, noncompliance with diet, exercise, monitoring. Check HbA1c and urine microalb/cr today.  Hyperlipidemia Lab Results  Component Value Date   CHOL 174 05/31/2013   HDL 43.10 05/31/2013   LDLCALC 98 05/31/2013   LDLDIRECT 107.1 02/02/2011   TRIG 167.0* 05/31/2013   CHOLHDL 4 05/31/2013   Tolerating 40mg  simva well. Continue this.  Not fasting today. Will check fasting lipid panel at next f/u in 63mo.  An After Visit Summary was printed and given to the patient.  Pt  declined flu vaccine today.  Return in about 6 months (around 12/17/2014) for routine chronic illness f/u (fasting, 30 min appt).

## 2014-06-17 NOTE — Patient Instructions (Signed)
Try OTC generic for senakot S (senna plus) and take 2 tabs every night. Continue probiotic.

## 2014-06-17 NOTE — Assessment & Plan Note (Signed)
Hx of poor control, noncompliance with diet, exercise, monitoring. Check HbA1c and urine microalb/cr today.

## 2014-06-17 NOTE — Progress Notes (Signed)
Pre visit review using our clinic review tool, if applicable. No additional management support is needed unless otherwise documented below in the visit note. 

## 2014-06-17 NOTE — Assessment & Plan Note (Signed)
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 He is worse the last 1 month. Plan is to check KUB today to assess stool burden--currently he says he feels like he is NOT full of stool. Start daily senakot S generic OTC, 2 tabs qhs. Start amitiza 8 mcg bid.  May still use miralax on prn basis. We'll refer him to [facility] for ongoing GI follow up (he is about due for his 10 yr repeat colonoscopy+he has had a change in bowel habits over the last 1 mo). Check CBC and CMET today.

## 2014-06-18 ENCOUNTER — Other Ambulatory Visit: Payer: Self-pay | Admitting: Family Medicine

## 2014-06-18 ENCOUNTER — Encounter: Payer: Self-pay | Admitting: Gastroenterology

## 2014-06-18 ENCOUNTER — Encounter: Payer: Self-pay | Admitting: Family Medicine

## 2014-06-18 MED ORDER — PIOGLITAZONE HCL 30 MG PO TABS: 30.0000 mg | ORAL_TABLET | Freq: Every day | ORAL | Status: DC

## 2014-06-27 ENCOUNTER — Encounter: Payer: Self-pay | Admitting: Gastroenterology

## 2014-06-27 ENCOUNTER — Ambulatory Visit (INDEPENDENT_AMBULATORY_CARE_PROVIDER_SITE_OTHER): Payer: No Typology Code available for payment source | Admitting: Gastroenterology

## 2014-06-27 VITALS — BP 110/80 | HR 80 | Ht 72.5 in | Wt 238.2 lb

## 2014-06-27 DIAGNOSIS — K5900 Constipation, unspecified: Secondary | ICD-10-CM

## 2014-06-27 DIAGNOSIS — Z1211 Encounter for screening for malignant neoplasm of colon: Secondary | ICD-10-CM

## 2014-06-27 NOTE — Patient Instructions (Signed)
You have been scheduled for a colonoscopy. Please follow written instructions given to you at your visit today.  Please pick up your prep kit at the pharmacy within the next 1-3 days. If you use inhalers (even only as needed), please bring them with you on the day of your procedure. Your physician has requested that you go to www.startemmi.com and enter the access code given to you at your visit today. This web site gives a general overview about your procedure. However, you should still follow specific instructions given to you by our office regarding your preparation for the procedure.  Please purchase Miralax over the counter and take one capful in mix in eight ounces of juice or water, drink once daily

## 2014-06-27 NOTE — Progress Notes (Addendum)
06/27/2014 Glenn Arnold [id number] 1955/09/26   HISTORY OF PRESENT ILLNESS:  This is a 58 year old male who was new to our practice and referred here by his PCP, Dr. Anitra Lauth. He presents to our office today to discuss recent issues with worsening constipation. He says that he has struggled with constipation on and off his whole life, but it has seemed to get somewhat worse recently. At one point he was very constipated and had a lot of back pain associated with it. He also complains of some mild constant discomfort around his belt line, but mostly complained of back pain. Once the constipation resolved the back pain resolved as well. He still has a constant ache in his lower abdomen, however. He has been taking one Senokot daily, which does help with his constipation, but seems to be a little too much at times. He complains of gas pains and discomfort as well. Denies seeing blood in his stool. His last colonoscopy was over 10 years ago in [place] and states it was normal at that time. He denies any family history of colon cancer. Abdominal x-ray on October 12 showed small amount of colonic stool. Recent CBC and CMP were relatively unremarkable.   Past Medical History  Diagnosis Date  . Diabetes mellitus   . High cholesterol   . Cervical stenosis of spinal canal     Dr Joya Salm  . CTS (carpal tunnel syndrome) 2013    RUE > LUE  . Chronic renal insufficiency, stage II (mild)     CrCl in 60s   Past Surgical History  Procedure Laterality Date  . Hand surgery Left     post fracture   . Dental surgery      implants ([employer])  . Colonoscopy  ? 2005-2007    Pt not sure of timing.  Says it was done in [place] and it was normal.  . Cervical myelogram  2013  . Microscopic cervical disc surgery  02/28/2012    Dr Antony Contras, [place] , [place]    reports that he quit smoking about 39 years ago. His smoking use included Cigarettes. He smoked 0.00 packs per day. He has never used smokeless tobacco.  He reports that he does not drink alcohol or use illicit drugs. family history includes Breast cancer in his sister; Diabetes in his father and mother; Heart disease in his father; Stroke (age of onset: 81) in his paternal grandfather. No Known Allergies    Outpatient Encounter Prescriptions as of 06/27/2014  Medication Sig  . aspirin 81 MG tablet Take 81 mg by mouth daily.    Marland Kitchen glucose blood (ONE TOUCH ULTRA TEST) test strip 1 each by Other route. Check blood sugar once daily     . JANUMET 50-1000 MG per tablet TAKE 1 TABLET BY MOUTH TWICE DAILY WITH LARGEST MEALS  . ONETOUCH DELICA LANCETS MISC by Does not apply route. Check blood sugar once daily   . pioglitazone (ACTOS) 30 MG tablet Take 1 tablet (30 mg total) by mouth daily.  . ramipril (ALTACE) 2.5 MG capsule TAKE 1 CAPSULE BY MOUTH ONCE DAILY  . senna (SENOKOT) 8.6 MG tablet Take 1 tablet by mouth daily.  . simvastatin (ZOCOR) 40 MG tablet TAKE 1 TABLET BY MOUTH AT BEDTIME  . [DISCONTINUED] lubiprostone (AMITIZA) 8 MCG capsule Take 1 capsule (8 mcg total) by mouth 2 (two) times daily with a meal.  . [DISCONTINUED] pioglitazone (ACTOS) 15 MG tablet TAKE ONE TABLET BY MOUTH ONE TIME DAILY  REVIEW OF SYSTEMS  : All other systems reviewed and negative except where noted in the History of Present Illness.   PHYSICAL EXAM: BP 110/80  Pulse 80  Ht 6' 0.5" (1.842 m)  Wt 238 lb 4 oz (108.069 kg)  BMI 31.85 kg/m2 General: Well developed white male in no acute distress Head: Normocephalic and atraumatic Eyes:  Sclerae anicteric, conjunctiva pink. Ears: Normal auditory acuity. Lungs: Clear throughout to auscultation Heart: Regular rate and rhythm Abdomen: Soft, non-distended.  Normal bowel sounds.  Mild TTP just below the umbilicus. Rectal:  Will be done at the time of colonoscopy. Musculoskeletal: Symmetrical with no gross deformities  Skin: No lesions on visible extremities Extremities: No edema  Neurological: Alert oriented  x 4, grossly non-focal Psychological:  Alert and cooperative. Normal mood and affect  ASSESSMENT AND PLAN: -Screening colonoscopy:  Will schedule with Dr. Hilarie Fredrickson.  The risks, benefits, and alternatives were discussed with the patient and he consents to proceed. -Constipation:  Lifelong, but somewhat worse recently.  Will start Miralax daily in place of the Senokot. -Abdominal pain:  Mostly relieved when constipation improved.  Addendum: Reviewed and agree with initial management. Jerene Bears, MD

## 2014-08-12 ENCOUNTER — Encounter: Payer: No Typology Code available for payment source | Admitting: Internal Medicine

## 2014-08-16 ENCOUNTER — Encounter: Payer: No Typology Code available for payment source | Admitting: Internal Medicine

## 2014-09-11 ENCOUNTER — Telehealth: Payer: Self-pay | Admitting: Family Medicine

## 2014-09-11 MED ORDER — SITAGLIPTIN PHOS-METFORMIN HCL 50-1000 MG PO TABS: ORAL_TABLET | ORAL | Status: DC

## 2014-09-11 NOTE — Telephone Encounter (Signed)
Rx sent 

## 2014-09-11 NOTE — Telephone Encounter (Signed)
Needs authorization for a refill on Janumet for Mr. Glenn Arnold.

## 2014-09-13 ENCOUNTER — Telehealth: Payer: Self-pay | Admitting: Internal Medicine

## 2014-09-13 ENCOUNTER — Telehealth: Payer: Self-pay | Admitting: Family Medicine

## 2014-09-13 MED ORDER — MOVIPREP 100 G PO SOLR: 1.0000 | Freq: Once | ORAL | Status: DC

## 2014-09-13 NOTE — Telephone Encounter (Signed)
Rx sent 

## 2014-09-13 NOTE — Telephone Encounter (Signed)
PA as just approved.  Faxed approval letter to [employer].

## 2014-09-13 NOTE — Telephone Encounter (Signed)
Due to new Insurance [employer] needs Dr. Anitra Lauth to call [facility] on [address]; [phone] to give them a referral for him to have his Janumet refilled.

## 2014-09-16 ENCOUNTER — Telehealth: Payer: Self-pay

## 2014-09-16 NOTE — Telephone Encounter (Signed)
Pt called requesting status of prior authorization on Janumet. I spoke with [employer] and they state the PA was denied because patient has not tried and failed these medications. They are Nash Shearer, and [employer]. Did you want to switch patient to one of these medications, [employer] will pay for these. Please advise. Or we can go through an appeal process.

## 2014-09-16 NOTE — Telephone Encounter (Signed)
OK. Pls d/c pt's janumet and do eRx for Jentadueto 2.01/999, 1 tab po bid, #60, RF x 11.--Tell pt this is essentially the same medication.-thx

## 2014-09-17 ENCOUNTER — Other Ambulatory Visit: Payer: Self-pay | Admitting: Family Medicine

## 2014-09-17 DIAGNOSIS — E119 Type 2 diabetes mellitus without complications: Secondary | ICD-10-CM

## 2014-09-17 MED ORDER — LINAGLIPTIN-METFORMIN HCL 2.5-1000 MG PO TABS: 2.5000 mg | ORAL_TABLET | Freq: Two times a day (BID) | ORAL | Status: DC

## 2014-09-17 NOTE — Telephone Encounter (Signed)
Spoke with pt, advised message from Dr. Anitra Lauth. Pt seemed unhappy and requested a referral to an endocrinologist. Please advise.

## 2014-09-17 NOTE — Telephone Encounter (Signed)
Fine. I'll enter referral now.

## 2014-09-17 NOTE — Telephone Encounter (Signed)
Rx sent. Left message for pt to call me back to explain.

## 2014-09-20 ENCOUNTER — Ambulatory Visit (AMBULATORY_SURGERY_CENTER): Payer: 59 | Admitting: Internal Medicine

## 2014-09-20 ENCOUNTER — Encounter: Payer: Self-pay | Admitting: Internal Medicine

## 2014-09-20 VITALS — BP 129/89 | HR 64 | Temp 98.8°F | Resp 14 | Ht 72.5 in | Wt 238.0 lb

## 2014-09-20 DIAGNOSIS — Z1211 Encounter for screening for malignant neoplasm of colon: Secondary | ICD-10-CM

## 2014-09-20 DIAGNOSIS — D122 Benign neoplasm of ascending colon: Secondary | ICD-10-CM

## 2014-09-20 DIAGNOSIS — K5900 Constipation, unspecified: Secondary | ICD-10-CM

## 2014-09-20 MED ORDER — LINACLOTIDE 145 MCG PO CAPS: 145.0000 ug | ORAL_CAPSULE | Freq: Every day | ORAL | Status: DC

## 2014-09-20 MED ORDER — SODIUM CHLORIDE 0.9 % IV SOLN: 500.0000 mL | INTRAVENOUS | Status: DC

## 2014-09-20 NOTE — Op Note (Signed)
[place]  [address] [place], [address]   COLONOSCOPY PROCEDURE REPORT  PATIENT: Glenn Arnold, Glenn Arnold  MR#: [id number] BIRTHDATE: [birth year] , 58  yrs. old GENDER: male ENDOSCOPIST: Jerene Bears, MD REFERRED KT:[id number] Anitra Lauth, M.D. PROCEDURE DATE:  09/20/2014 PROCEDURE:   Colonoscopy with snare polypectomy First Screening Colonoscopy - Avg.  risk and is 50 yrs.  old or older Yes.  Prior Negative Screening - Now for repeat screening. N/A  History of Adenoma - Now for follow-up colonoscopy & has been > or = to 3 yrs.  N/A  Polyps Removed Today? Yes. ASA CLASS:   Class II INDICATIONS:average risk for colon cancer and first colonoscopy. MEDICATIONS: Monitored anesthesia care and Propofol 270 mg IV  DESCRIPTION OF PROCEDURE:   After the risks benefits and alternatives of the procedure were thoroughly explained, informed consent was obtained.  The digital rectal exam revealed no rectal mass.   The LB [id number]  endoscope was introduced through the anus and advanced to the cecum, which was identified by both the appendix and ileocecal valve. No adverse events experienced. The quality of the prep was excellent, using MoviPrep  The instrument was then slowly withdrawn as the colon was fully examined.  COLON FINDINGS: Two sessile polyps ranging between 3-48mm in size were found in the ascending colon.  Polypectomies were performed with a cold snare.  The resection was complete, the polyp tissue was completely retrieved and sent to histology.   There was moderate diverticulosis noted in the descending colon and sigmoid colon with associated muscular hypertrophy.  Retroflexed views revealed no abnormalities. The time to cecum=3 minutes 00 seconds. Withdrawal time=10 minutes 45 seconds.  The scope was withdrawn and the procedure completed. COMPLICATIONS: There were no immediate complications.  ENDOSCOPIC IMPRESSION: 1.   Two sessile polyps ranging between 3-51mm  in size were found in the ascending colon; polypectomies were performed with a cold snare  2.   There was moderate diverticulosis noted in the descending colon and sigmoid colon  RECOMMENDATIONS: 1.  Await pathology results 2.  High fiber diet 3.  If the polyps removed today are proven to be adenomatous (pre-cancerous) polyps, you will need a repeat colonoscopy in 5 years.  Otherwise you should continue to follow colorectal cancer screening guidelines for "routine risk" patients with colonoscopy in 10 years.  You will receive a letter within 1-2 weeks with the results of your biopsy as well as final recommendations.  Please call my office if you have not received a letter after 3 weeks. 4.  Trial of Linzess 145 g daily for constipation (this will replace Senokot)  eSigned:  Jerene Bears, MD 09/20/2014 3:10 PM cc: Ricardo Jericho, MD and The Patient

## 2014-09-20 NOTE — Progress Notes (Signed)
Called to room to assist during endoscopic procedure.  Patient ID and intended procedure confirmed with present staff. Received instructions for my participation in the procedure from the performing physician.  

## 2014-09-20 NOTE — Progress Notes (Signed)
Procedure ends, to recovery, report given and VSS. 

## 2014-09-20 NOTE — Patient Instructions (Addendum)

## 2014-09-23 ENCOUNTER — Telehealth: Payer: Self-pay | Admitting: *Deleted

## 2014-09-23 NOTE — Telephone Encounter (Signed)
  Follow up Call-  Call back number 09/20/2014  Post procedure Call Back phone  # [phone]  Permission to leave phone message Yes     Patient questions:  Do you have a fever, pain , or abdominal swelling? No. Pain Score  0 *  Have you tolerated food without any problems? Yes.    Have you been able to return to your normal activities? Yes.    Do you have any questions about your discharge instructions: Diet   No. Medications  No. Follow up visit  No.  Do you have questions or concerns about your Care? No.  Actions: * If pain score is 4 or above: No action needed, pain <4.

## 2014-09-23 NOTE — Telephone Encounter (Signed)
Patient's Linzess has been approved from [facility] and [employer] until 03/24/15. PA # [id number].

## 2014-09-24 ENCOUNTER — Telehealth: Payer: Self-pay | Admitting: Internal Medicine

## 2014-09-24 NOTE — Telephone Encounter (Signed)
Patient advised that Linzess was approved through insurance yesterday. He verbalizes understanding.

## 2014-09-26 ENCOUNTER — Encounter: Payer: Self-pay | Admitting: Internal Medicine

## 2014-10-14 ENCOUNTER — Encounter: Payer: Self-pay | Admitting: Endocrinology

## 2014-10-14 ENCOUNTER — Ambulatory Visit (INDEPENDENT_AMBULATORY_CARE_PROVIDER_SITE_OTHER): Payer: 59 | Admitting: Endocrinology

## 2014-10-14 ENCOUNTER — Other Ambulatory Visit: Payer: Self-pay | Admitting: *Deleted

## 2014-10-14 VITALS — BP 109/78 | HR 78 | Temp 98.3°F | Resp 16 | Ht 72.0 in | Wt 236.2 lb

## 2014-10-14 DIAGNOSIS — E785 Hyperlipidemia, unspecified: Secondary | ICD-10-CM | POA: Diagnosis not present

## 2014-10-14 DIAGNOSIS — E119 Type 2 diabetes mellitus without complications: Secondary | ICD-10-CM

## 2014-10-14 LAB — BASIC METABOLIC PANEL
BUN: 13 mg/dL (ref 6–23)
CALCIUM: 9.4 mg/dL (ref 8.4–10.5)
CHLORIDE: 103 meq/L (ref 96–112)
CO2: 30 meq/L (ref 19–32)
CREATININE: 1.24 mg/dL (ref 0.40–1.50)
GFR: 63.5 mL/min (ref >60.00)
Glucose, Bld: 112 mg/dL — ABNORMAL HIGH (ref 70–99)
POTASSIUM: 3.9 meq/L (ref 3.5–5.1)
SODIUM: 138 meq/L (ref 135–145)

## 2014-10-14 LAB — HEMOGLOBIN A1C: Hgb A1c MFr Bld: 7.7 % — ABNORMAL HIGH (ref 4.6–6.5)

## 2014-10-14 MED ORDER — SAXAGLIPTIN-METFORMIN ER 2.5-1000 MG PO TB24: ORAL_TABLET | ORAL | Status: DC

## 2014-10-14 MED ORDER — GLUCOSE BLOOD VI STRP: ORAL_STRIP | Status: AC

## 2014-10-14 NOTE — Patient Instructions (Addendum)
Please check blood sugars at least half the time about 2 hours after any meal and 3 times per week on waking up. Please bring blood sugar monitor to each visit. Recommended blood sugar levels about 2 hours after meal is 140-160 and on waking up 90-130  Walk briskly daily

## 2014-10-14 NOTE — Progress Notes (Signed)
Patient ID: Glenn Arnold, male   DOB: 12-18-1955, 59 y.o.   MRN: [id number]            Reason for Appointment: Consultation for Type 2 Diabetes  Referring physician: Linna Darner  History of Present Illness:          Diagnosis: Type 2 diabetes mellitus, date of diagnosis: ?  2010       Past history:  At the time of diagnosis his glucose was only mildly increased with A1c 6.5 He has been on various oral hypoglycemic drugs in the last 5 years. He probably has taken metformin for several years and at some point was was changed to [facility], probably in 2012 Also 1 mg Amaryl was added around the same time to improve his control. Amaryl was subsequently stopped because of tendency to hypoglycemia. He says that when his sugars were well controlled he stopped checking his blood sugar He has been followed about once or twice a year or so for his diabetes and in 9/14 because of an A1c of 8% he was given Actos also with some improvement in control  Recent history:  He had requested a consultation for his diabetes management and is here for second opinion. He had been taking Janumet last year with Actos.  However he was not able to remember to take this twice a day as directed and frequently would be taking it only once a day. He has not checked his blood sugar in quite a while although he thinks he still has a meter at home. He has not been motivated to watch his diet or exercise and has not lost any weight recently Lab glucose in 10/15 was 169 nonfasting. Because of his A1c still over 7% he was told to increase his Actos to 30 mg Has not had a follow-up since that time In 1/16 because of insurance preference his Janumet was changed to [facility] and he is still unable to take this consistently twice a day. He is asking about his optimal diabetes management       Oral hypoglycemic drugs the patient is taking are:   Jentadueto daily-twice a day, Actos 30 mg     Side effects from medications have  been:  none Compliance with the medical regimen: Poor  Glucose monitoring:  none         Glucometer: One Touch.      Blood Glucose readings not available   Self-care: The diet that the patient has been following is none  Meals:  2 meals per day.  Usually skipping breakfast.  He does not think he is watching his diet           Exercise: Some walking with his dialysis, not a brisk pace           Dietician visit, most recent: Never .               Weight history:  Wt Readings from Last 3 Encounters:  10/14/14 236 lb 3.2 oz (107.14 kg)  09/20/14 238 lb (107.956 kg)  06/27/14 238 lb 4 oz (108.069 kg)    Glycemic control:   Lab Results  Component Value Date   HGBA1C 7.3* 06/17/2014   HGBA1C 8.0* 05/31/2013   HGBA1C 6.8* 10/26/2012   Lab Results  Component Value Date   MICROALBUR 0.2 06/17/2014   LDLCALC 98 05/31/2013   CREATININE 1.2 06/17/2014         Medication List       This list  is accurate as of: 10/14/14  2:28 PM.  Always use your most recent med list.               aspirin 81 MG tablet  Take 81 mg by mouth daily.     Linaclotide 145 MCG Caps capsule  Commonly known as:  LINZESS  Take 1 capsule (145 mcg total) by mouth daily.     Linagliptin-Metformin HCl 2.01-999 MG Tabs  Take 2.5-1,000 mg by mouth 2 (two) times daily.     ONE TOUCH ULTRA TEST test strip  Generic drug:  glucose blood  - 1 each by Other route. Check blood sugar once daily   -      ONETOUCH DELICA LANCETS Misc  by Does not apply route. Check blood sugar once daily     pioglitazone 30 MG tablet  Commonly known as:  ACTOS  Take 1 tablet (30 mg total) by mouth daily.     ramipril 2.5 MG capsule  Commonly known as:  ALTACE  TAKE 1 CAPSULE BY MOUTH ONCE DAILY     senna 8.6 MG tablet  Commonly known as:  SENOKOT  Take 1 tablet by mouth daily.     simvastatin 40 MG tablet  Commonly known as:  ZOCOR  TAKE 1 TABLET BY MOUTH AT BEDTIME        Allergies: No Known  Allergies  Past Medical History  Diagnosis Date  . Diabetes mellitus   . High cholesterol   . Cervical stenosis of spinal canal     Dr Joya Salm  . CTS (carpal tunnel syndrome) 2013    RUE > LUE  . Chronic renal insufficiency, stage II (mild)     CrCl in 60s    Past Surgical History  Procedure Laterality Date  . Hand surgery Left     post fracture   . Dental surgery      implants ([employer])  . Colonoscopy  ? 2005-2007    Pt not sure of timing.  Says it was done in [place] and it was normal.  . Cervical myelogram  2013  . Microscopic cervical disc surgery  02/28/2012    Dr Audelia Hives , [place]    Family History  Problem Relation Age of Onset  . Heart disease Father     congenital; S/P CBAG & valve replacement  . Diabetes Father   . Stroke Paternal Grandfather 7  . Diabetes Mother   . Breast cancer Sister     Social History:  reports that he quit smoking about 40 years ago. His smoking use included Cigarettes. He has never used smokeless tobacco. He reports that he does not drink alcohol or use illicit drugs.    Review of Systems       Vision is normal. Most recent eye exam was 7/15, no retinopathy       Lipids: Has been on Zocor for quite some time, no recent labs available       Lab Results  Component Value Date   CHOL 174 05/31/2013   HDL 43.10 05/31/2013   LDLCALC 98 05/31/2013   LDLDIRECT 107.1 02/02/2011   TRIG 167.0* 05/31/2013   CHOLHDL 4 05/31/2013                  Skin: No rash or infections     Thyroid:  No  unusual fatigue.     The blood pressure has been normal but he has been on low-dose ramipril prophylactically  No swelling of feet.     No shortness of breath or chest tightness  on exertion.     Bowel habits: Constipation present.      No joint  pains.          No history of Numbness, tingling or burning in feet      No complaints of erectile dysfunction   LABS:  No visits with results within 1 Week(s) from this  visit. Latest known visit with results is:  Office Visit on 06/17/2014  Component Date Value Ref Range Status  . WBC 06/17/2014 7.8  4.0 - 10.5 K/uL Final  . RBC 06/17/2014 5.42  4.22 - 5.81 Mil/uL Final  . Hemoglobin 06/17/2014 15.6  13.0 - 17.0 g/dL Final  . HCT 06/17/2014 47.0  39.0 - 52.0 % Final  . MCV 06/17/2014 86.6  78.0 - 100.0 fl Final  . MCHC 06/17/2014 33.3  30.0 - 36.0 g/dL Final  . RDW 06/17/2014 13.7  11.5 - 15.5 % Final  . Platelets 06/17/2014 296.0  150.0 - 400.0 K/uL Final  . Neutrophils Relative % 06/17/2014 72.3  43.0 - 77.0 % Final  . Lymphocytes Relative 06/17/2014 20.9  12.0 - 46.0 % Final  . Monocytes Relative 06/17/2014 6.3  3.0 - 12.0 % Final  . Eosinophils Relative 06/17/2014 0.3  0.0 - 5.0 % Final  . Basophils Relative 06/17/2014 0.2  0.0 - 3.0 % Final  . Neutro Abs 06/17/2014 5.6  1.4 - 7.7 K/uL Final  . Lymphs Abs 06/17/2014 1.6  0.7 - 4.0 K/uL Final  . Monocytes Absolute 06/17/2014 0.5  0.1 - 1.0 K/uL Final  . Eosinophils Absolute 06/17/2014 0.0  0.0 - 0.7 K/uL Final  . Basophils Absolute 06/17/2014 0.0  0.0 - 0.1 K/uL Final  . Hgb A1c MFr Bld 06/17/2014 7.3* 4.6 - 6.5 % Final   Glycemic Control Guidelines for People with Diabetes:Non Diabetic:  <6%Goal of Therapy: <7%Additional Action Suggested:  >8%   . Microalb, Ur 06/17/2014 0.2  0.0 - 1.9 mg/dL Final  . Creatinine,U 06/17/2014 59.9   Final  . Microalb Creat Ratio 06/17/2014 0.3  0.0 - 30.0 mg/g Final  . Sodium 06/17/2014 134* 135 - 145 mEq/L Final  . Potassium 06/17/2014 4.4  3.5 - 5.1 mEq/L Final  . Chloride 06/17/2014 101  96 - 112 mEq/L Final  . CO2 06/17/2014 27  19 - 32 mEq/L Final  . Glucose, Bld 06/17/2014 169* 70 - 99 mg/dL Final  . BUN 06/17/2014 13  6 - 23 mg/dL Final  . Creatinine, Ser 06/17/2014 1.2  0.4 - 1.5 mg/dL Final  . Total Bilirubin 06/17/2014 0.9  0.2 - 1.2 mg/dL Final  . Alkaline Phosphatase 06/17/2014 29* 39 - 117 U/L Final  . AST 06/17/2014 19  0 - 37 U/L Final  . ALT  06/17/2014 21  0 - 53 U/L Final  . Total Protein 06/17/2014 7.6  6.0 - 8.3 g/dL Final  . Albumin 06/17/2014 3.8  3.5 - 5.2 g/dL Final  . Calcium 06/17/2014 9.6  8.4 - 10.5 mg/dL Final  . GFR 06/17/2014 68.65  >60.00 mL/min Final    Physical Examination:  BP 109/78 mmHg  Pulse 78  Temp(Src) 98.3 F (36.8 C)  Resp 16  Ht 6' (1.829 m)  Wt 236 lb 3.2 oz (107.14 kg)  BMI 32.03 kg/m2  SpO2 96%  GENERAL:         Patient has generalized obesity.   HEENT:  Eye exam shows normal external appearance. Fundus exam shows no retinopathy. Oral exam shows normal mucosa .  NECK:         General:  Neck exam shows no lymphadenopathy. Carotids are normal to palpation and no bruit heard. Thyroid is not enlarged and no nodules felt.   LUNGS:         Chest is symmetrical. Lungs are clear to auscultation.Marland Kitchen   HEART:         Heart sounds:  S1 and S2 are normal. No murmurs or clicks heard., no S3 or S4.   ABDOMEN:   There is no distention present. Liver and spleen are not palpable. No other mass or tenderness present.  EXTREMITIES:     There is no edema. No skin lesions present.Marland Kitchen  NEUROLOGICAL:   Vibration sense is moderately reduced in right toes and nearly absent on the left. Ankle jerks are absent bilaterally.           Diabetic foot exam shows normal monofilament sensation in the toes and plantar surfaces, no skin lesions or ulcers on the feet and normal posterior tibialis pedal pulses MUSCULOSKELETAL:       There is no enlargement or deformity of the joints. Spine is normal to inspection.Marland Kitchen   SKIN:       No rash or lesions of concern.        ASSESSMENT:  Diabetes type 2, uncontrolled   He has had consistently high A1c over 7% Part of his difficulty with control is not being able to comply with his twice a day regimen of either Janumet or Jentadueto  He has also not been watching his diet and has not been motivated to do this He has not doing any regular exercise program, just doing some casual  walking with his dogs.  Discussed with the patient the need for lifestyle management especially to help with weight loss and maintain control over longer period of time without insulin He appears to have had some benefit from Actos and no side effects and can continue this   Complications: None, has early objective signs of neuropathy.  Has normal urine microalbumin recently  HYPERLIPIDEMIA: He does need follow-up, has been on simvastatin for some time  PLAN:   Change Jentadueto to Kombiglyze XR 2.01/999, 2 tablets daily for better compliance.  He can continue to take Actos 30 mg daily  Restart home glucose monitoring checking fasting or postprandial readings by rotation.  Discussed blood sugar targets for both readings.  Consultation with the dietitian  Brisk walking at least every other day or other aerobic exercise like basketball  Follow-up with PCP for general physical exam and lipid panel  Check A1c today  Will have him follow-up in 3 months unless A1c is significantly high today  Recheck lipids on the next visit if not done already  Counseling time over 50% of today's 45 minute visit  Evadean Sproule 10/14/2014, 2:28 PM   Note: This office note was prepared with Dragon voice recognition system technology. Any transcriptional errors that result from this process are unintentional.

## 2014-10-15 NOTE — Progress Notes (Signed)
Quick Note:  A1c is slightly higher at 7.7 and should improve with new medication, no other change needed ______

## 2014-11-14 ENCOUNTER — Other Ambulatory Visit: Payer: Self-pay | Admitting: Internal Medicine

## 2014-12-18 IMAGING — CR DG FOOT COMPLETE 3+V*R*
3 series · 3 of 3 positions shown · non-contrast
Comparison: Right fifth toe films same date.

CLINICAL DATA: Injury.

RIGHT FOOT COMPLETE - 3+ VIEW

[t foot ap right]
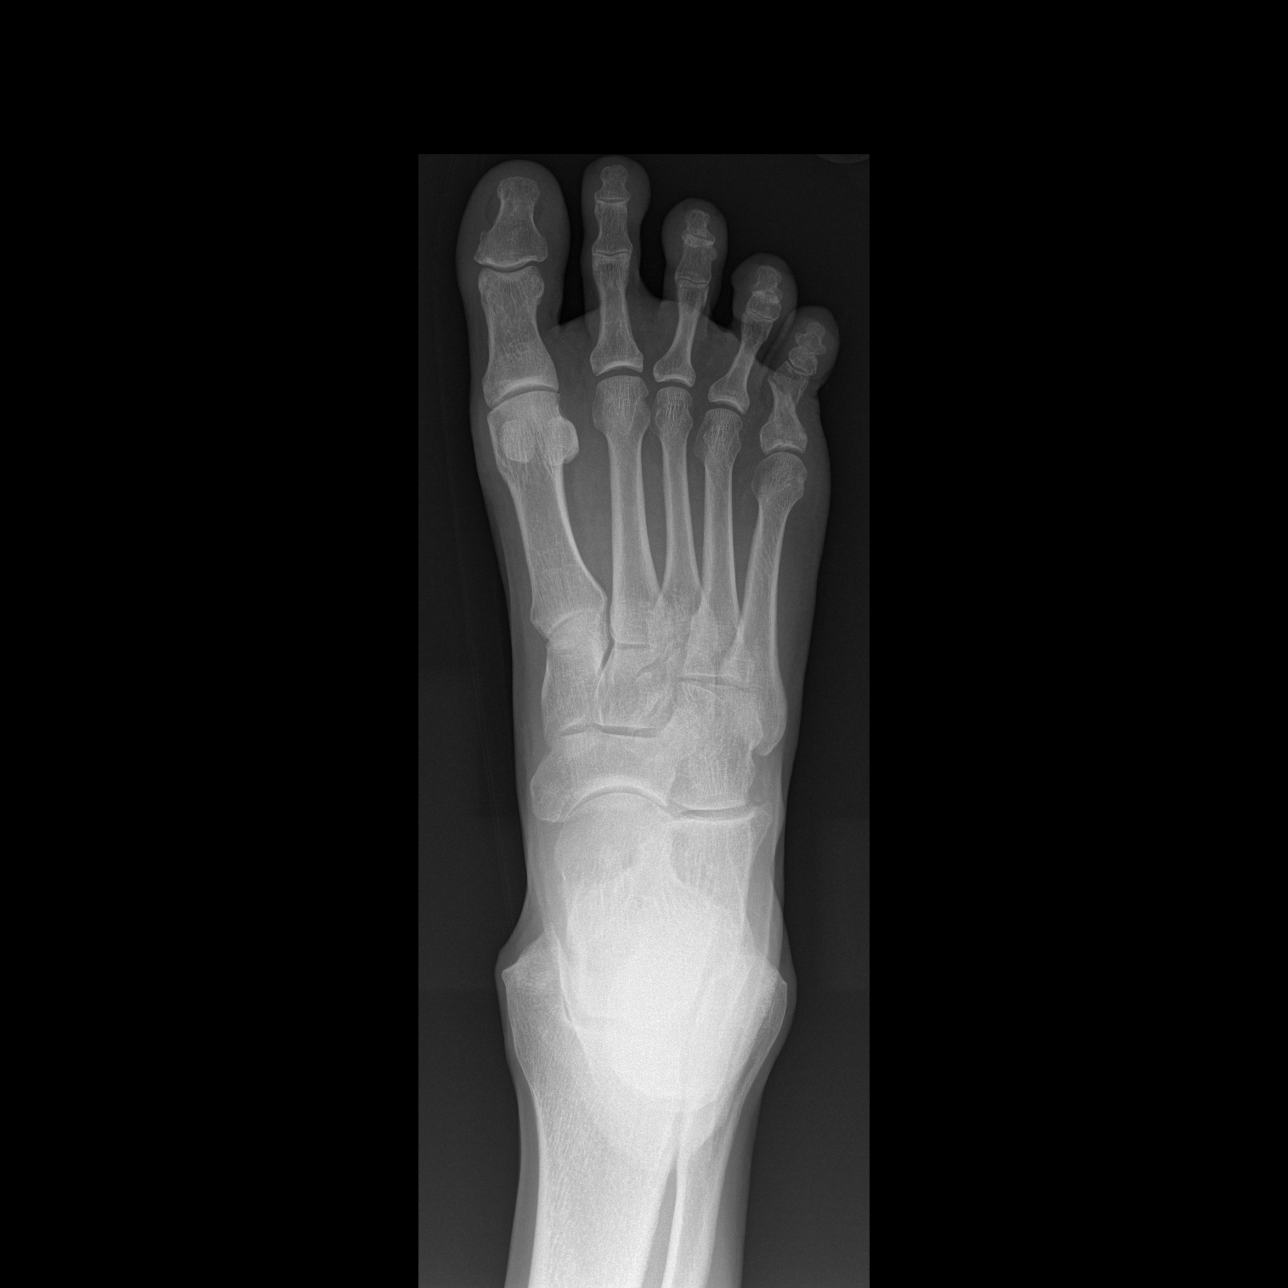

[t foot oblique right]
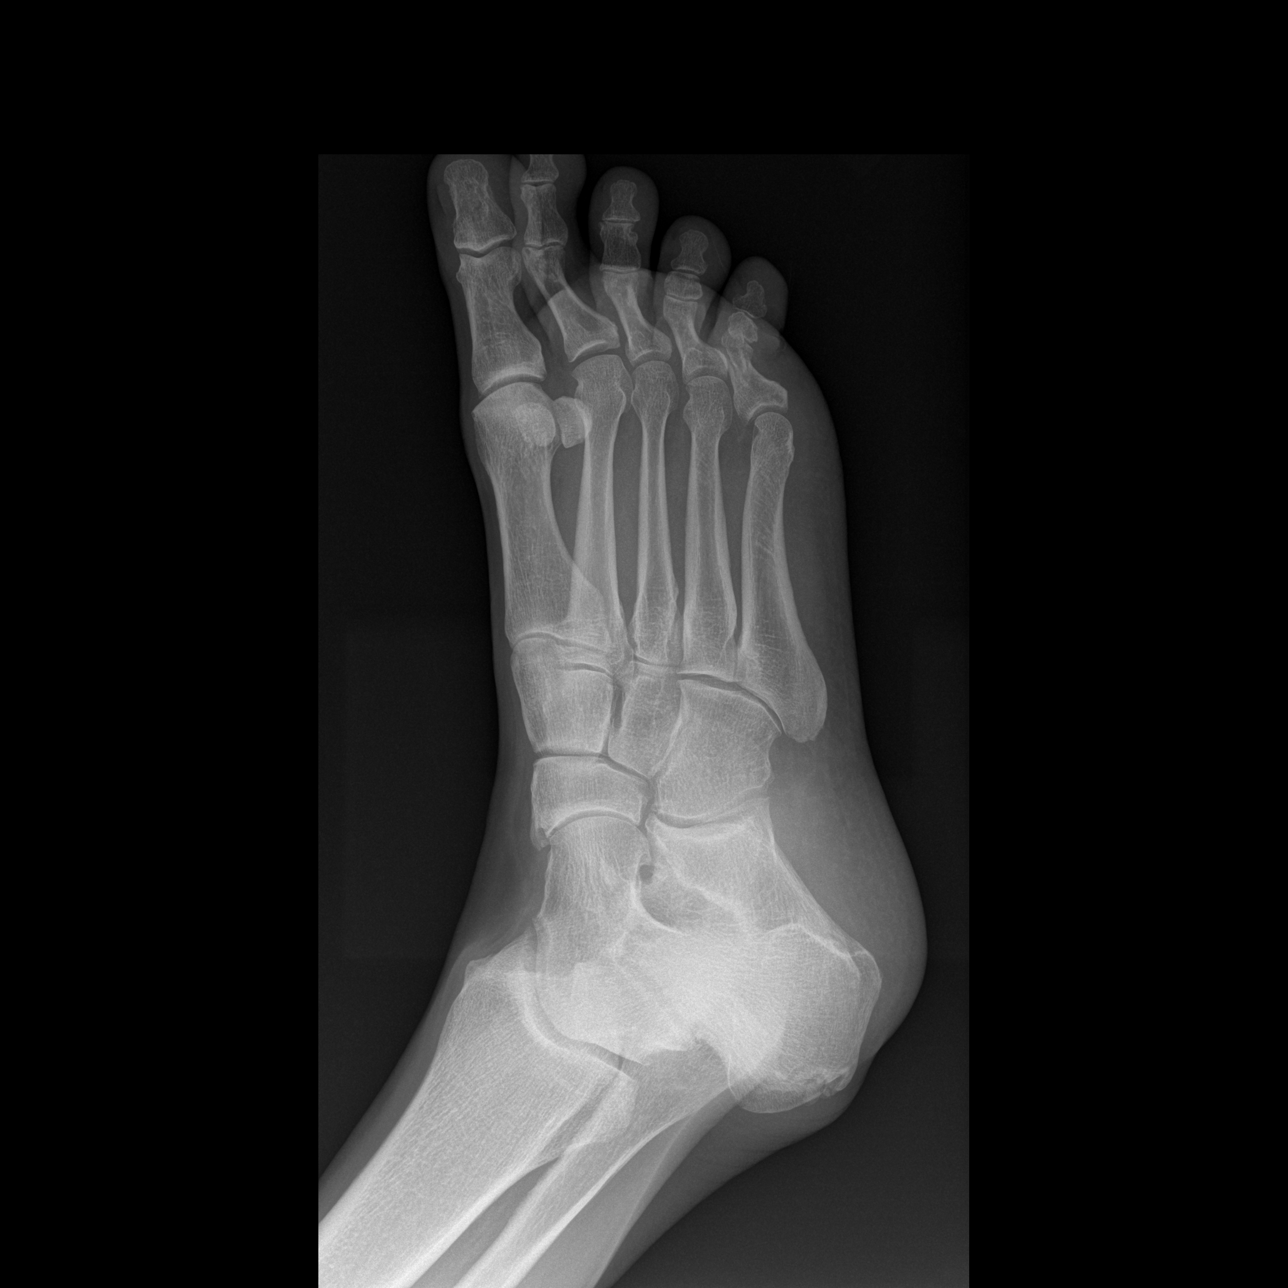

[t foot lat right]
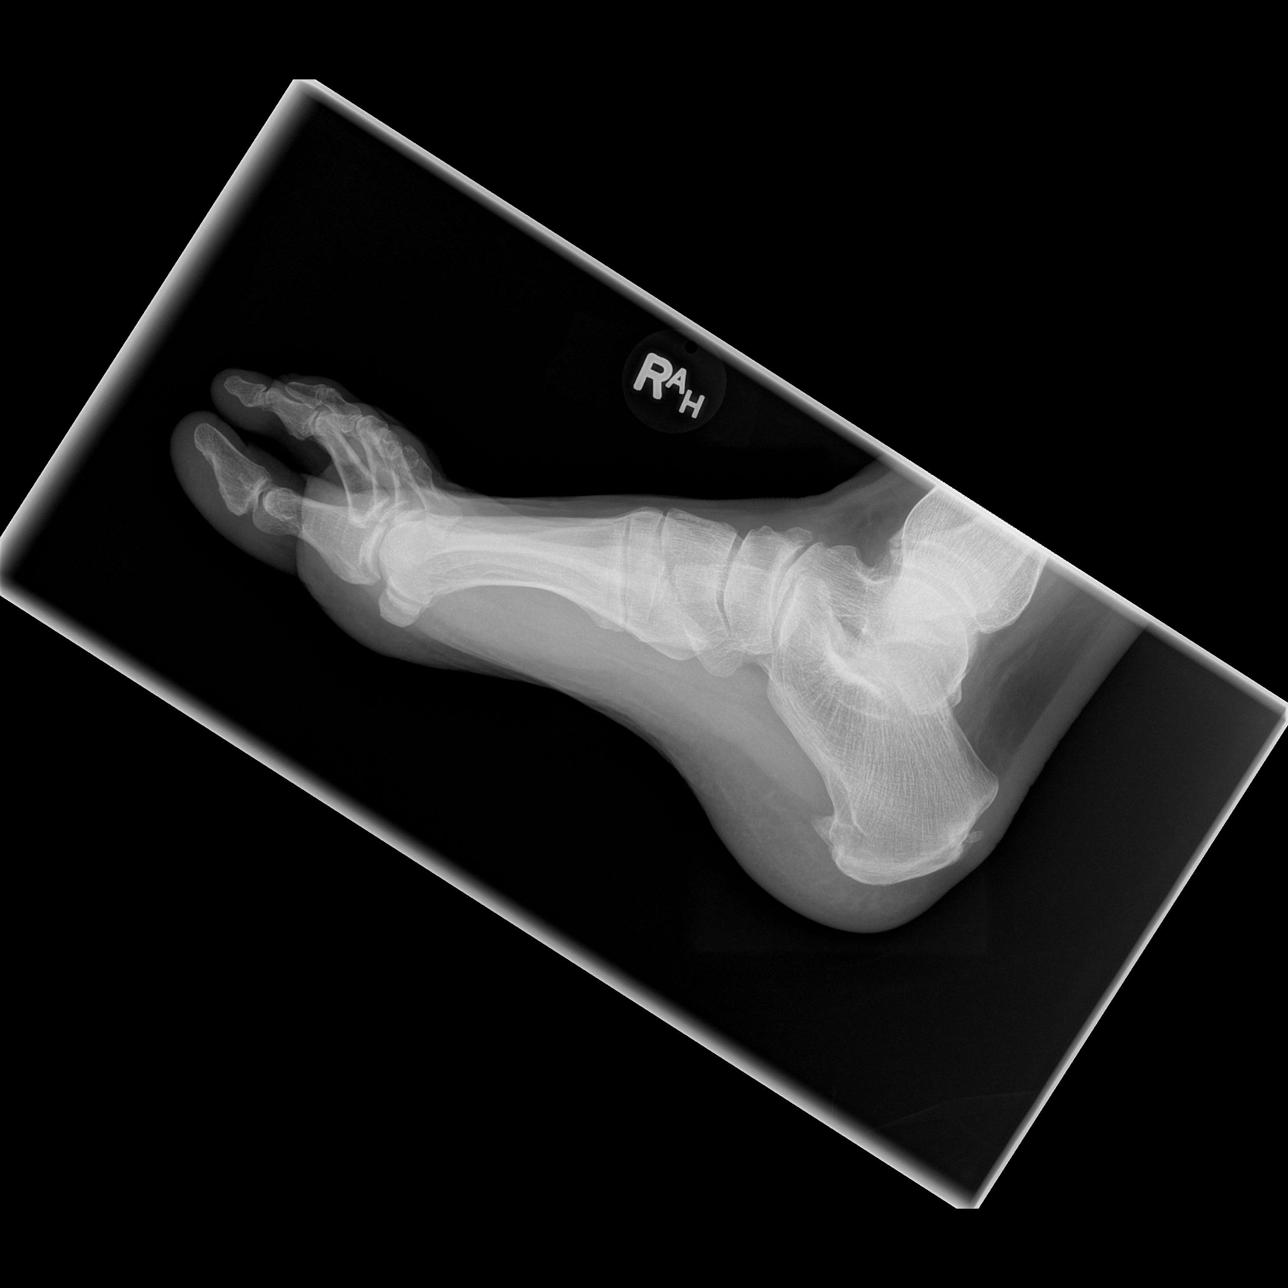

[3 of 3 positions shown; findings below may reference images not displayed]

FINDINGS: Angulated fracture of the proximal fifth phalanx once
again noted.  No other fracture or dislocation noted.
IMPRESSION: Angulated fracture of the proximal fifth phalanx once again noted.
No other fracture or dislocation noted.

## 2014-12-20 ENCOUNTER — Other Ambulatory Visit: Payer: Self-pay | Admitting: *Deleted

## 2014-12-20 MED ORDER — PIOGLITAZONE HCL 30 MG PO TABS: 30.0000 mg | ORAL_TABLET | Freq: Every day | ORAL | Status: DC

## 2015-01-03 ENCOUNTER — Other Ambulatory Visit: Payer: Self-pay | Admitting: Endocrinology

## 2015-01-08 ENCOUNTER — Other Ambulatory Visit (INDEPENDENT_AMBULATORY_CARE_PROVIDER_SITE_OTHER): Payer: 59

## 2015-01-08 DIAGNOSIS — E119 Type 2 diabetes mellitus without complications: Secondary | ICD-10-CM | POA: Diagnosis not present

## 2015-01-08 LAB — LIPID PANEL
Cholesterol: 139 mg/dL (ref 0–200)
HDL: 36.9 mg/dL — ABNORMAL LOW (ref >39.00)
NonHDL: 102.1
TRIGLYCERIDES: 248 mg/dL — AB (ref 0.0–149.0)
Total CHOL/HDL Ratio: 4
VLDL: 49.6 mg/dL — ABNORMAL HIGH (ref 0.0–40.0)

## 2015-01-08 LAB — COMPREHENSIVE METABOLIC PANEL
ALT: 15 U/L (ref 0–53)
AST: 14 U/L (ref 0–37)
Albumin: 4.1 g/dL (ref 3.5–5.2)
Alkaline Phosphatase: 27 U/L — ABNORMAL LOW (ref 39–117)
BUN: 12 mg/dL (ref 6–23)
CALCIUM: 9.3 mg/dL (ref 8.4–10.5)
CO2: 25 meq/L (ref 19–32)
Chloride: 103 mEq/L (ref 96–112)
Creatinine, Ser: 1.09 mg/dL (ref 0.40–1.50)
GFR: 73.62 mL/min (ref >60.00)
GLUCOSE: 116 mg/dL — AB (ref 70–99)
POTASSIUM: 4 meq/L (ref 3.5–5.1)
Sodium: 135 mEq/L (ref 135–145)
TOTAL PROTEIN: 6.9 g/dL (ref 6.0–8.3)
Total Bilirubin: 0.5 mg/dL (ref 0.2–1.2)

## 2015-01-08 LAB — HEMOGLOBIN A1C: HEMOGLOBIN A1C: 6.3 % (ref 4.6–6.5)

## 2015-01-08 LAB — LDL CHOLESTEROL, DIRECT: Direct LDL: 69 mg/dL

## 2015-01-13 ENCOUNTER — Ambulatory Visit (INDEPENDENT_AMBULATORY_CARE_PROVIDER_SITE_OTHER): Payer: 59 | Admitting: Endocrinology

## 2015-01-13 ENCOUNTER — Encounter: Payer: Self-pay | Admitting: Endocrinology

## 2015-01-13 VITALS — BP 124/84 | HR 68 | Temp 98.3°F | Resp 16 | Ht 72.0 in | Wt 241.2 lb

## 2015-01-13 DIAGNOSIS — E119 Type 2 diabetes mellitus without complications: Secondary | ICD-10-CM | POA: Diagnosis not present

## 2015-01-13 DIAGNOSIS — E785 Hyperlipidemia, unspecified: Secondary | ICD-10-CM

## 2015-01-13 NOTE — Patient Instructions (Addendum)
Please check blood sugars at least half the time about 2 hours after any meal and 3 times per week on waking up.  Please bring blood sugar monitor to each visit.  Recommended blood sugar levels about 2 hours after meal is 140-180 and on waking up 90-130  EXERCISE 6-3/8 days  Diastolic BP ideally <46

## 2015-01-13 NOTE — Progress Notes (Signed)
Patient ID: Glenn Arnold, male   DOB: [birth year], 59 y.o.   MRN: [id number]            Reason for Appointment:  F/u for Type 2 Diabetes  Referring physician: Linna Darner  History of Present Illness:          Diagnosis: Type 2 diabetes mellitus, date of diagnosis: ?  2010       Past history:  At the time of diagnosis his glucose was only mildly increased with A1c 6.5 He has been on various oral hypoglycemic drugs in the last 5 years. He probably has taken metformin for several years and at some point was was changed to [facility], probably in 2012 Also 1 mg Amaryl was added around the same time to improve his control. Amaryl was subsequently stopped because of tendency to hypoglycemia. He says that when his sugars were well controlled he stopped checking his blood sugar He has been followed about once or twice a year or so for his diabetes and in 9/14 because of an A1c of 8% he was given Actos also with some improvement in control  Recent history:  He has generally been not been motivated to watch his diet or exercise or to check his blood sugars at home Also has had difficulty with compliance of medications in the past because of needing to take some of them twice a day and he would forget the second dose  Because of his A1c being 7.7 and poor control he was changed to Kombiglyze XR once a day in 10/2014 He is able to take this consistently with his Actos once a day No side effects with this Although his sugars are excellent as judged by his A1c of 6.3 he has still not monitored any readings at home Does not have any exercise regimen He is refusing to consider consultation with dietitian and does not think he can change his eating habits including eating out at fast food places       Oral hypoglycemic drugs the patient is taking are:  Actos 30 mg, Kombiglyze XR 2.01/999, 2 tablets daily       Side effects from medications have been:  none Compliance with the medical regimen: Fair    Glucose monitoring:  none         Glucometer: One Touch.      Blood Glucose readings not available  Self-care: The diet that the patient has been following is none  Meals:  2 meals per day.  Usually skipping breakfast.  He does not think he is watching his diet any better, eating out frequently           Exercise: Some gardening            Dietician visit, most recent: Never .               Weight history:  Wt Readings from Last 3 Encounters:  01/13/15 241 lb 3.2 oz (109.408 kg)  10/14/14 236 lb 3.2 oz (107.14 kg)  09/20/14 238 lb (107.956 kg)    Glycemic control:   Lab Results  Component Value Date   HGBA1C 6.3 01/08/2015   HGBA1C 7.7* 10/14/2014   HGBA1C 7.3* 06/17/2014   Lab Results  Component Value Date   MICROALBUR 0.2 06/17/2014   [place] 98 05/31/2013   CREATININE 1.09 01/08/2015         Medication List       This list is accurate as of: 01/13/15  1:59 PM.  Always use your most recent med list.               aspirin 81 MG tablet  Take 81 mg by mouth daily.     glucose blood test strip  Commonly known as:  ONE TOUCH ULTRA TEST  Use to check blood sugar once a day dx code E11.65     KOMBIGLYZE XR 2.01-999 MG Tb24  Generic drug:  Saxagliptin-Metformin  TAKE 2 TABLETS BY MOUTH DAILY     LINZESS 145 MCG Caps capsule  Generic drug:  Linaclotide  TAKE 1 CAPSULE BY MOUTH DAILY     ONETOUCH DELICA LANCETS Misc  by Does not apply route. Check blood sugar once daily     pioglitazone 30 MG tablet  Commonly known as:  ACTOS  Take 1 tablet (30 mg total) by mouth daily.     ramipril 2.5 MG capsule  Commonly known as:  ALTACE  TAKE 1 CAPSULE BY MOUTH ONCE DAILY     senna 8.6 MG tablet  Commonly known as:  SENOKOT  Take 1 tablet by mouth daily.     simvastatin 40 MG tablet  Commonly known as:  ZOCOR  TAKE 1 TABLET BY MOUTH AT BEDTIME        Allergies: No Known Allergies  Past Medical History  Diagnosis Date  . Diabetes mellitus   . High  cholesterol   . Cervical stenosis of spinal canal     Dr Joya Salm  . CTS (carpal tunnel syndrome) 2013    RUE > LUE  . Chronic renal insufficiency, stage II (mild)     CrCl in 60s    Past Surgical History  Procedure Laterality Date  . Hand surgery Left     post fracture   . Dental surgery      implants ([employer])  . Colonoscopy  ? 2005-2007    Pt not sure of timing.  Says it was done in [place] and it was normal.  . Cervical myelogram  2013  . Microscopic cervical disc surgery  02/28/2012    Dr Audelia Hives , [place]    Family History  Problem Relation Age of Onset  . Heart disease Father     congenital; S/P CBAG & valve replacement  . Diabetes Father   . Stroke Paternal Grandfather 56  . Diabetes Mother   . Breast cancer Sister     Social History:  reports that he quit smoking about 40 years ago. His smoking use included Cigarettes. He has never used smokeless tobacco. He reports that he does not drink alcohol or use illicit drugs.    Review of Systems   Most recent eye exam was 7/15, no retinopathy       Lipids: Has been on Zocor for quite some time with good control of LDL but his nonfasting triglycerides are over 200       Lab Results  Component Value Date   CHOL 139 01/08/2015   HDL 36.90* 01/08/2015   LDLCALC 98 05/31/2013   LDLDIRECT 69.0 01/08/2015   TRIG 248.0* 01/08/2015   CHOLHDL 4 01/08/2015              .     The blood pressure has been normal previously but he has been on low-dose ramipril prophylactically Blood pressure is relatively higher in the office today    LABS:  Lab on 01/08/2015  Component Date Value Ref Range Status  . Hgb A1c MFr Bld 01/08/2015 6.3  4.6 - 6.5 % Final   Glycemic Control Guidelines for People with Diabetes:Non Diabetic:  <6%Goal of Therapy: <7%Additional Action Suggested:  >8%   . Cholesterol 01/08/2015 139  0 - 200 mg/dL Final   ATP III Classification       Desirable:  < 200 mg/dL               Borderline High:   200 - 239 mg/dL          High:  > = 240 mg/dL  . Triglycerides 01/08/2015 248.0* 0.0 - 149.0 mg/dL Final   Normal:  <150 mg/dLBorderline High:  150 - 199 mg/dL  . HDL 01/08/2015 36.90* >39.00 mg/dL Final  . VLDL 01/08/2015 49.6* 0.0 - 40.0 mg/dL Final  . Total CHOL/HDL Ratio 01/08/2015 4   Final                  Men          Women1/2 Average Risk     3.4          3.3Average Risk          5.0          4.42X Average Risk          9.6          7.13X Average Risk          15.0          11.0                      . NonHDL 01/08/2015 102.10   Final   NOTE:  Non-HDL goal should be 30 mg/dL higher than patient's LDL goal (i.e. LDL goal of < 70 mg/dL, would have non-HDL goal of < 100 mg/dL)  . Sodium 01/08/2015 135  135 - 145 mEq/L Final  . Potassium 01/08/2015 4.0  3.5 - 5.1 mEq/L Final  . Chloride 01/08/2015 103  96 - 112 mEq/L Final  . CO2 01/08/2015 25  19 - 32 mEq/L Final  . Glucose, Bld 01/08/2015 116* 70 - 99 mg/dL Final  . BUN 01/08/2015 12  6 - 23 mg/dL Final  . Creatinine, Ser 01/08/2015 1.09  0.40 - 1.50 mg/dL Final  . Total Bilirubin 01/08/2015 0.5  0.2 - 1.2 mg/dL Final  . Alkaline Phosphatase 01/08/2015 27* 39 - 117 U/L Final  . AST 01/08/2015 14  0 - 37 U/L Final  . ALT 01/08/2015 15  0 - 53 U/L Final  . Total Protein 01/08/2015 6.9  6.0 - 8.3 g/dL Final  . Albumin 01/08/2015 4.1  3.5 - 5.2 g/dL Final  . Calcium 01/08/2015 9.3  8.4 - 10.5 mg/dL Final  . GFR 01/08/2015 73.62  >60.00 mL/min Final  . Direct LDL 01/08/2015 69.0   Final   Optimal:  <100 mg/dLNear or Above Optimal:  100-129 mg/dLBorderline High:  130-159 mg/dLHigh:  160-189 mg/dLVery High:  >190 mg/dL    Diabetic foot exam 2/16  Physical Examination:  BP 124/84 mmHg  Pulse 68  Temp(Src) 98.3 F (36.8 C)  Resp 16  Ht 6' (1.829 m)  Wt 241 lb 3.2 oz (109.408 kg)  BMI 32.71 kg/m2  SpO2 97%       ASSESSMENT:  Diabetes type 2, uncontrolled   He has had consistently high A1c over 7% because of poor compliance  with medication However with taking Kombiglyze once a day, 2 tablets his compliance has improved considerably With this his blood sugars are  excellent as judged by his A1c was 6.3 Nonfasting glucose in the lab was 116.  Discussed again with the patient the need for lifestyle management especially to help with weight loss and maintain control over longer period of time without insulin.  However he is refusing to consider seeing the dietitian and does not want to do any regular exercise program Also does not want to check his blood sugar as he thinks he can feel when the blood sugars going up   HYPERLIPIDEMIA: He does need follow-up fasting lipids, currently LDL is controlled but triglycerides are high  ?  Hypertension: He will try to check his blood pressure at the drug store, may have mild white coat syndrome  PLAN:  No change in diabetes medications Encouraged him to start walking or other exercise program Reminded him to start checking blood sugars periodically Given him booklet on fast food nutrition values to help him make better choices when eating out  [facility] 01/13/2015, 1:59 PM   Note: This office note was prepared with Dragon voice recognition system technology. Any transcriptional errors that result from this process are unintentional.

## 2015-01-28 LAB — HM DIABETES EYE EXAM

## 2015-01-29 ENCOUNTER — Encounter: Payer: Self-pay | Admitting: Endocrinology

## 2015-03-12 ENCOUNTER — Other Ambulatory Visit: Payer: Self-pay | Admitting: *Deleted

## 2015-03-12 MED ORDER — PIOGLITAZONE HCL 30 MG PO TABS: 30.0000 mg | ORAL_TABLET | Freq: Every day | ORAL | Status: DC

## 2015-03-31 ENCOUNTER — Other Ambulatory Visit: Payer: Self-pay | Admitting: Endocrinology

## 2015-05-13 ENCOUNTER — Other Ambulatory Visit: Payer: 59

## 2015-05-16 ENCOUNTER — Ambulatory Visit: Payer: 59 | Admitting: Endocrinology

## 2015-06-09 ENCOUNTER — Other Ambulatory Visit: Payer: 59

## 2015-06-12 ENCOUNTER — Telehealth: Payer: Self-pay | Admitting: Endocrinology

## 2015-06-12 ENCOUNTER — Ambulatory Visit: Payer: 59 | Admitting: Endocrinology

## 2015-06-12 DIAGNOSIS — Z0289 Encounter for other administrative examinations: Secondary | ICD-10-CM

## 2015-06-12 NOTE — Telephone Encounter (Signed)
L/m to call back to reschedule

## 2015-06-12 NOTE — Telephone Encounter (Signed)
Patient Name: Glenn Arnold Gender: Male DOB: 28-Jan-1956 Age: 59 Y 3 M 19 D Return Phone Number: [phone] (Primary) Address: City/State/Zip: [place] Client [name] Night - Client Client Site [facility] Physician Elayne Snare Contact Type [place] Phone Number [phone] Relationship To Patient Self Is this call to report lab results? No Call Type General Information Initial Comment Caller states that he had an appointment around 2pm on 06-12-15 that needs to be rescheduled. Please call back. General Information Type Appointment

## 2015-07-08 ENCOUNTER — Other Ambulatory Visit: Payer: Self-pay

## 2015-07-08 MED ORDER — SAXAGLIPTIN-METFORMIN ER 2.5-1000 MG PO TB24: 2.0000 | ORAL_TABLET | Freq: Every day | ORAL | Status: DC

## 2015-07-10 ENCOUNTER — Telehealth: Payer: Self-pay | Admitting: *Deleted

## 2015-07-10 MED ORDER — RAMIPRIL 2.5 MG PO CAPS: ORAL_CAPSULE | ORAL | Status: DC

## 2015-07-10 MED ORDER — SIMVASTATIN 40 MG PO TABS: ORAL_TABLET | ORAL | Status: DC

## 2015-07-10 NOTE — Telephone Encounter (Signed)
LOV: Per Dr. Anitra Lauth been 1 year since lov NOV: None  RF request for ramipril Last written: 06/17/14 #90 w/ 4RF  RF request for simvastatin Last written: 06/17/14 #90 w/ 4RF  Per Dr. Anitra Lauth okay to fill both Rx for #30 w/ 0RF,  pt needs ov for more refills or he can start having Dr. Dwyane Dee (Endo) fill these medications.   Left message for pt to call back.

## 2015-07-18 NOTE — Telephone Encounter (Signed)
Left message for pt to call back  °

## 2015-07-22 NOTE — Telephone Encounter (Signed)
Left message for pt to call back  °

## 2015-08-12 NOTE — Telephone Encounter (Signed)
Pt has not returned any phone calls. Will save message to chart.

## 2015-08-28 ENCOUNTER — Encounter: Payer: Self-pay | Admitting: Family Medicine

## 2015-08-28 ENCOUNTER — Ambulatory Visit (INDEPENDENT_AMBULATORY_CARE_PROVIDER_SITE_OTHER): Payer: 59 | Admitting: Family Medicine

## 2015-08-28 VITALS — BP 119/87 | HR 90 | Temp 98.1°F | Resp 16 | Wt 238.8 lb

## 2015-08-28 DIAGNOSIS — E785 Hyperlipidemia, unspecified: Secondary | ICD-10-CM | POA: Diagnosis not present

## 2015-08-28 DIAGNOSIS — Z125 Encounter for screening for malignant neoplasm of prostate: Secondary | ICD-10-CM

## 2015-08-28 DIAGNOSIS — E119 Type 2 diabetes mellitus without complications: Secondary | ICD-10-CM

## 2015-08-28 MED ORDER — SIMVASTATIN 40 MG PO TABS: ORAL_TABLET | ORAL | Status: DC

## 2015-08-28 MED ORDER — RAMIPRIL 2.5 MG PO CAPS: ORAL_CAPSULE | ORAL | Status: DC

## 2015-08-28 NOTE — Progress Notes (Signed)
OFFICE VISIT  08/28/2015   CC:  Chief Complaint  Patient presents with  . Follow-up    Pt is not fasting.    HPI:    Patient is a 59 y.o. Caucasian male who presents for "med refills". His DM 2 is managed by Dr. Dwyane Dee.  He does not want to continue seeing him now that things are under control and his meds are arranged to his satisfaction He doesn't monitor glucoses at home, no home bp monitoring.  He does not eat a diabetic diet.    I brought up the topic of prostate ca screening b/c his last PSA was in 2010.  He says he wants PSA testing but doesn't want digital rectal exam.  He understands that this may limit our screening for prostate cancer.  ROS: no CP, SOB, dizziness, HAs, or LE swelling.  No burning, tingling, or numbness in feet.  Past Medical History  Diagnosis Date  . Diabetes mellitus   . High cholesterol   . Cervical stenosis of spinal canal     Dr Joya Salm  . CTS (carpal tunnel syndrome) 2013    RUE > LUE  . Chronic renal insufficiency, stage II (mild)     CrCl in 60s-70s  . Chronic constipation   . History of adenomatous polyp of colon     Past Surgical History  Procedure Laterality Date  . Hand surgery Left     post fracture   . Dental surgery      implants ([employer])  . Colonoscopy  Approx 2006; 09/2014    Pt not sure of timing; says it was done in [place] and it was normal.  Repeat 09/2014--two adenomatous polyps in ascending colon--recall 5 yrs.  . Cervical myelogram  2013  . Microscopic cervical disc surgery  02/28/2012    Dr Audelia Hives , [facility]    Outpatient Prescriptions Prior to Visit  Medication Sig Dispense Refill  . aspirin 81 MG tablet Take 81 mg by mouth daily.      Marland Kitchen glucose blood (ONE TOUCH ULTRA TEST) test strip Use to check blood sugar once a day dx code E11.65 50 each 3  . LINZESS 145 MCG CAPS capsule TAKE 1 CAPSULE BY MOUTH DAILY 30 capsule 3  . ONETOUCH DELICA LANCETS MISC by Does not apply route. Check blood sugar once daily      . pioglitazone (ACTOS) 30 MG tablet Take 1 tablet (30 mg total) by mouth daily. 90 tablet 1  . Saxagliptin-Metformin (KOMBIGLYZE XR) 2.01-999 MG TB24 Take 2 tablets by mouth daily. 60 tablet 1  . ramipril (ALTACE) 2.5 MG capsule TAKE 1 CAPSULE BY MOUTH ONCE DAILY 30 capsule 0  . simvastatin (ZOCOR) 40 MG tablet TAKE 1 TABLET BY MOUTH AT BEDTIME 30 tablet 0  . senna (SENOKOT) 8.6 MG tablet Take 1 tablet by mouth daily. Reported on 08/28/2015     No facility-administered medications prior to visit.    Allergies  Allergen Reactions  . Augmentin [Amoxicillin-Pot Clavulanate] Other (See Comments)    Info from old records ([facility] visit 12/04/14)    ROS As per HPI  PE: Blood pressure 119/87, pulse 90, temperature 98.1 F (36.7 C), temperature source Oral, resp. rate 16, weight 238 lb 12 oz (108.296 kg), SpO2 95 %. Gen: Alert, well appearing.  Patient is oriented to person, place, time, and situation. AFFECT: pleasant, lucid thought and speech. CV: RRR, distant S1 and S2, no m/r/g.   LUNGS: CTA bilat, nonlabored resps, good  aeration in all lung fields. EXT: no clubbing, cyanosis, or edema.   LABS:  Lab Results  Component Value Date   CHOL 139 01/08/2015   HDL 36.90* 01/08/2015   LDLCALC 98 05/31/2013   LDLDIRECT 69.0 01/08/2015   TRIG 248.0* 01/08/2015   CHOLHDL 4 01/08/2015     Chemistry      Component Value Date/Time   NA 135 01/08/2015 1526   K 4.0 01/08/2015 1526   CL 103 01/08/2015 1526   CO2 25 01/08/2015 1526   BUN 12 01/08/2015 1526   CREATININE 1.09 01/08/2015 1526      Component Value Date/Time   CALCIUM 9.3 01/08/2015 1526   ALKPHOS 27* 01/08/2015 1526   AST 14 01/08/2015 1526   ALT 15 01/08/2015 1526   BILITOT 0.5 01/08/2015 1526     Lab Results  Component Value Date   HGBA1C 6.3 01/08/2015   Lab Results  Component Value Date   PSA 0.44 11/27/2008   IMPRESSION AND PLAN:  1) DM 2, well controlled per A1c monitoring as of 01/2015. Repeat A1c  today. Urine microalb/cr today. No med changes at this time.  2) Hyperlipidemia: tolerating statin, lipids ok 01/2015 except triglycerides. Not fasting today--will recheck FLP at next f/u in 6 mo. RF'd zocor today.  3) HTN: well controlled.  RF'd ramipril today.  4) Prostate ca screening: he declined DRE but wants PSA so this lab was drawn today.  An After Visit Summary was printed and given to the patient.  FOLLOW UP: Return in about 6 months (around 02/26/2016) for annual CPE (fasting).

## 2015-08-28 NOTE — Progress Notes (Signed)
Pre visit review using our clinic review tool, if applicable. No additional management support is needed unless otherwise documented below in the visit note. 

## 2015-08-29 LAB — HEMOGLOBIN A1C: HEMOGLOBIN A1C: 6.6 % — AB (ref 4.6–6.5)

## 2015-08-29 LAB — COMPREHENSIVE METABOLIC PANEL
ALT: 13 U/L (ref 0–53)
AST: 13 U/L (ref 0–37)
Albumin: 4.6 g/dL (ref 3.5–5.2)
Alkaline Phosphatase: 36 U/L — ABNORMAL LOW (ref 39–117)
BILIRUBIN TOTAL: 0.7 mg/dL (ref 0.2–1.2)
BUN: 18 mg/dL (ref 6–23)
CALCIUM: 10.1 mg/dL (ref 8.4–10.5)
CHLORIDE: 102 meq/L (ref 96–112)
CO2: 29 meq/L (ref 19–32)
Creatinine, Ser: 1.08 mg/dL (ref 0.40–1.50)
GFR: 74.25 mL/min (ref >60.00)
GLUCOSE: 93 mg/dL (ref 70–99)
POTASSIUM: 4.5 meq/L (ref 3.5–5.1)
Sodium: 140 mEq/L (ref 135–145)
Total Protein: 7.5 g/dL (ref 6.0–8.3)

## 2015-08-29 LAB — PSA: PSA: 0.51 ng/mL (ref 0.10–4.00)

## 2015-08-29 LAB — MICROALBUMIN / CREATININE URINE RATIO
CREATININE, U: 299.7 mg/dL
MICROALB UR: 1.4 mg/dL (ref 0.0–1.9)
MICROALB/CREAT RATIO: 0.5 mg/g (ref 0.0–30.0)

## 2015-09-05 ENCOUNTER — Other Ambulatory Visit: Payer: Self-pay | Admitting: *Deleted

## 2015-09-05 MED ORDER — SAXAGLIPTIN-METFORMIN ER 2.5-1000 MG PO TB24
2.0000 | ORAL_TABLET | Freq: Every day | ORAL | Status: DC
Start: 2015-09-05 — End: 2016-01-06

## 2015-09-05 MED ORDER — PIOGLITAZONE HCL 30 MG PO TABS
30.0000 mg | ORAL_TABLET | Freq: Every day | ORAL | Status: DC
Start: 2015-09-05 — End: 2016-03-19

## 2015-09-05 NOTE — Telephone Encounter (Signed)
Pt is no longer seeing Dr. Dwyane Dee.  LOV: 08/28/15 NOV: None  RF request for pioglitazone Last written: 03/12/15 #90 w/ 1RF  RF request for kombiglyze Last written: 07/08/15 #60 w/ 1RF

## 2016-01-06 ENCOUNTER — Other Ambulatory Visit: Payer: Self-pay | Admitting: *Deleted

## 2016-01-06 MED ORDER — SAXAGLIPTIN-METFORMIN ER 2.5-1000 MG PO TB24: 2.0000 | ORAL_TABLET | Freq: Every day | ORAL | Status: DC

## 2016-01-06 NOTE — Telephone Encounter (Signed)
RF request for kombiglyze LOV: 08/28/15 Next ov: None Last written: 09/05/15 #180 w/ 1RF

## 2016-03-18 LAB — HM DIABETES EYE EXAM

## 2016-03-19 ENCOUNTER — Encounter: Payer: Self-pay | Admitting: Family Medicine

## 2016-03-19 ENCOUNTER — Other Ambulatory Visit: Payer: Self-pay | Admitting: Family Medicine

## 2016-03-19 IMAGING — CR DG ABDOMEN 1V
2 series · 2 of 2 positions shown · non-contrast
Comparison: None

CLINICAL DATA: Change in bowel habits in the last 1 month with
chronic constipation and low back pain.

EXAM:
ABDOMEN - 1 VIEW

[t abdomen supine (1 of 2)]
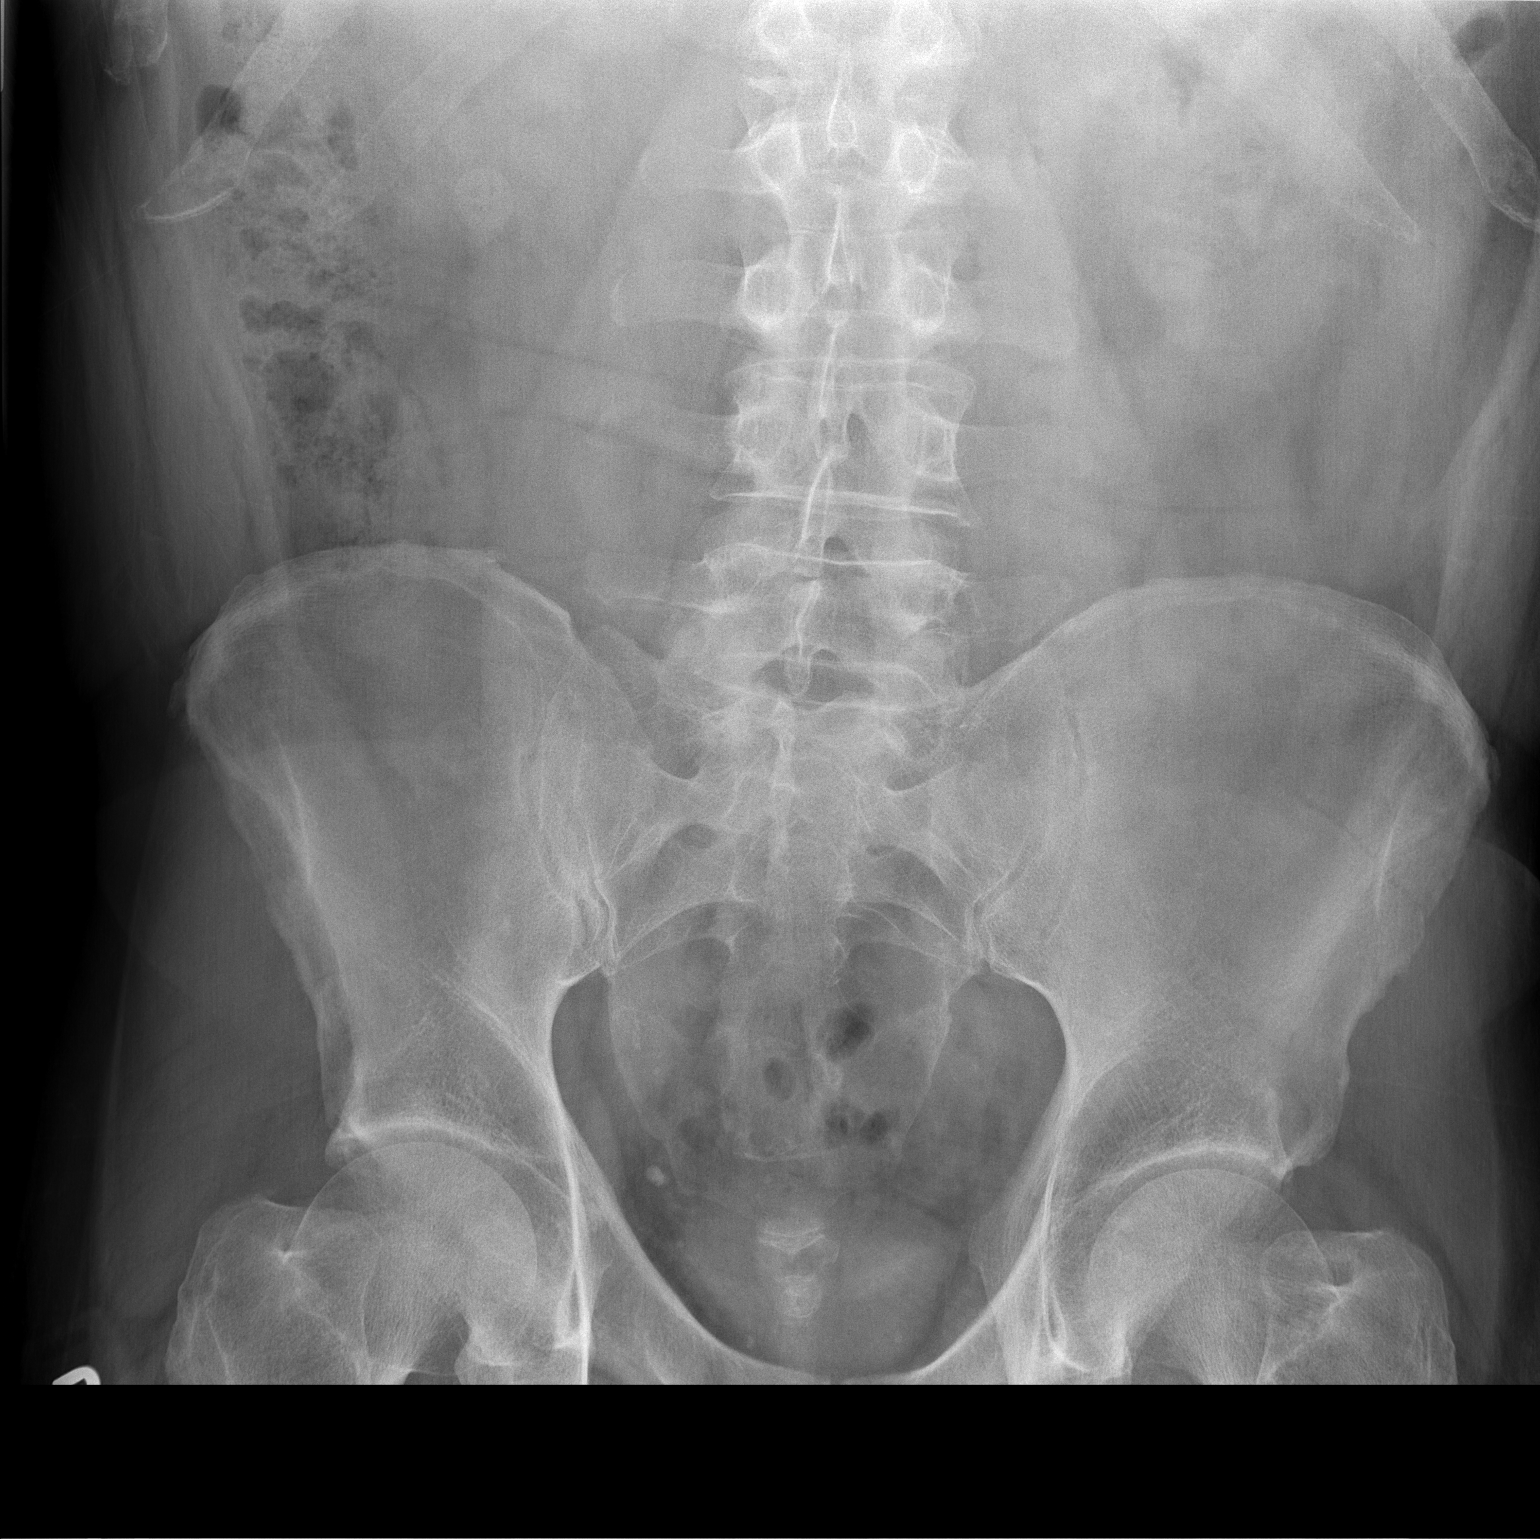

[t abdomen supine (2 of 2)]
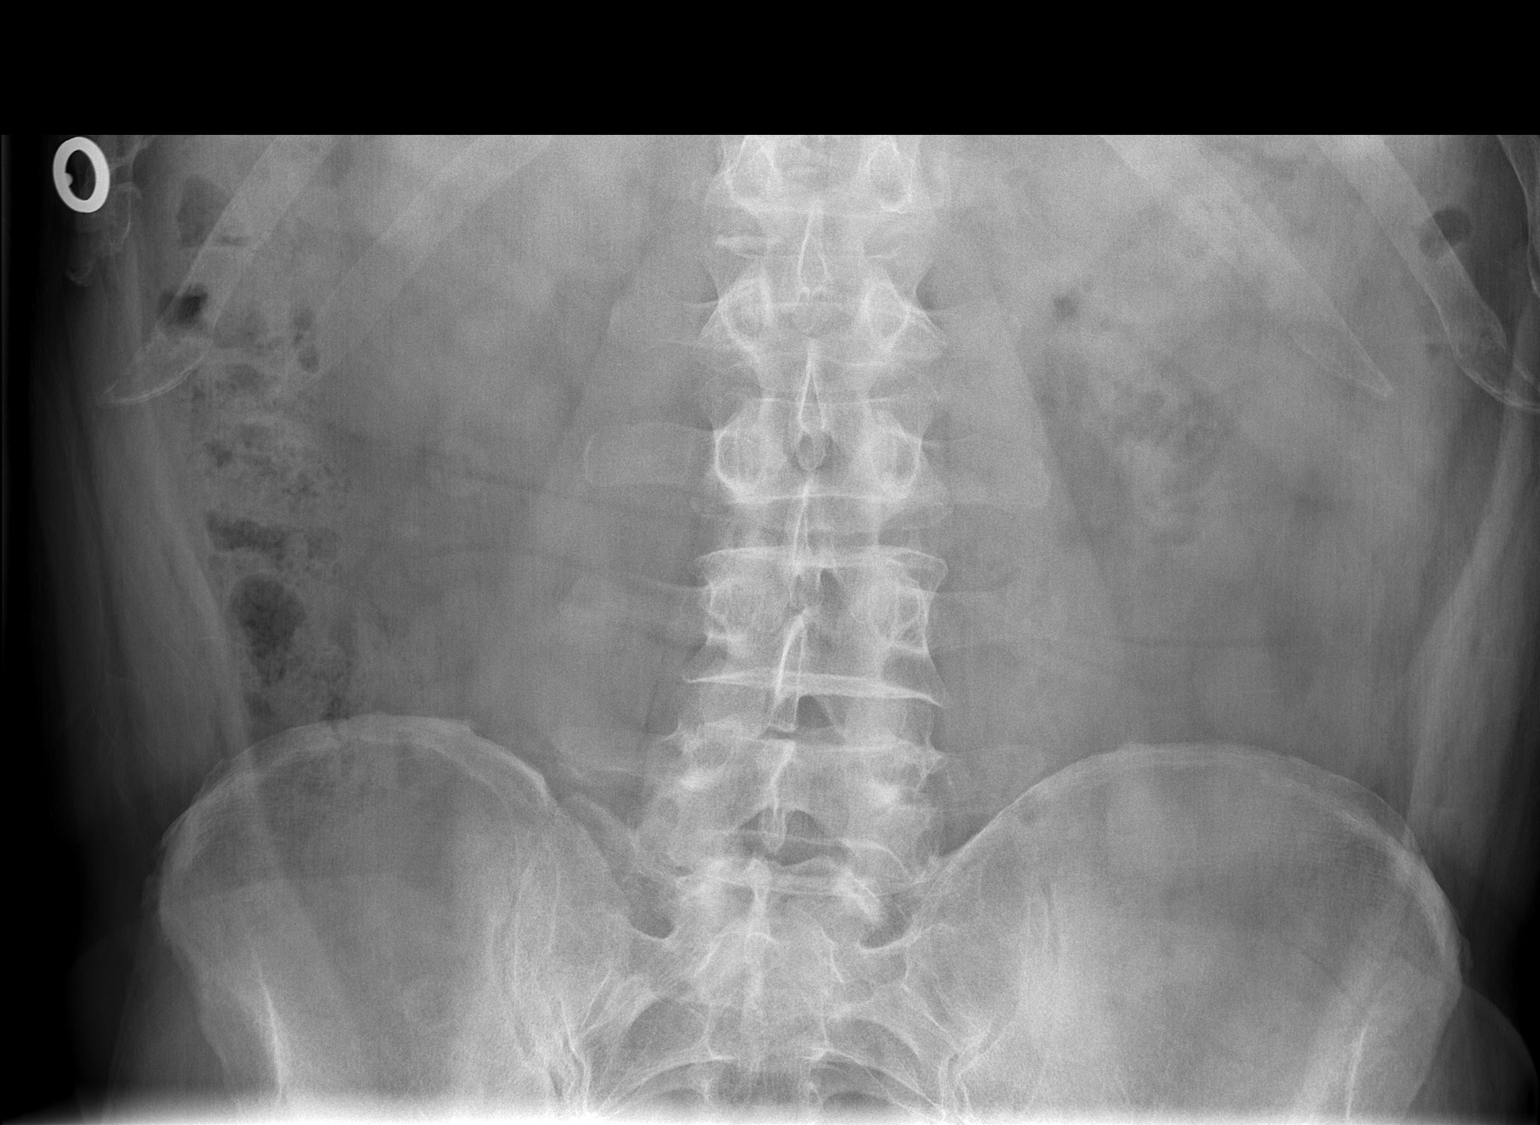

[2 of 2 positions shown; findings below may reference images not displayed]

FINDINGS: There is a paucity of small bowel gas, limiting evaluation for
obstruction. No dilated loops of bowel are seen. There is a small
amount stool throughout the colon. Calcifications in the pelvis
likely represent phleboliths. No acute osseous abnormality is
identified.
IMPRESSION: Small amount of colonic stool.

## 2016-05-17 ENCOUNTER — Other Ambulatory Visit: Payer: Self-pay | Admitting: Family Medicine

## 2016-05-17 NOTE — Telephone Encounter (Signed)
Pt requesting actos RF. Looks like patient was going to see Dr Dwyane Dee but has canceled and no show his last few appointments.  Last appt with you was 08/28/15. Please advise.

## 2016-05-17 NOTE — Telephone Encounter (Signed)
Patient aware.

## 2016-05-17 NOTE — Telephone Encounter (Signed)
I'll send in 90d supply. Pt must see me in office for diabetes f/u prior to any FURTHER RF's.

## 2016-06-10 ENCOUNTER — Other Ambulatory Visit: Payer: Self-pay | Admitting: Family Medicine

## 2016-06-15 ENCOUNTER — Other Ambulatory Visit: Payer: Self-pay | Admitting: Family Medicine

## 2016-06-16 ENCOUNTER — Other Ambulatory Visit: Payer: Self-pay | Admitting: Family Medicine

## 2016-06-17 ENCOUNTER — Other Ambulatory Visit: Payer: Self-pay | Admitting: Family Medicine

## 2016-07-26 ENCOUNTER — Other Ambulatory Visit: Payer: Self-pay | Admitting: Family Medicine

## 2016-08-18 ENCOUNTER — Ambulatory Visit (INDEPENDENT_AMBULATORY_CARE_PROVIDER_SITE_OTHER): Payer: BLUE CROSS/BLUE SHIELD | Admitting: Family Medicine

## 2016-08-18 ENCOUNTER — Encounter: Payer: Self-pay | Admitting: Family Medicine

## 2016-08-18 VITALS — BP 120/83 | HR 69 | Temp 98.1°F | Resp 16 | Ht 73.0 in | Wt 245.2 lb

## 2016-08-18 DIAGNOSIS — N182 Chronic kidney disease, stage 2 (mild): Secondary | ICD-10-CM

## 2016-08-18 DIAGNOSIS — Z1159 Encounter for screening for other viral diseases: Secondary | ICD-10-CM

## 2016-08-18 DIAGNOSIS — I10 Essential (primary) hypertension: Secondary | ICD-10-CM

## 2016-08-18 DIAGNOSIS — E119 Type 2 diabetes mellitus without complications: Secondary | ICD-10-CM | POA: Diagnosis not present

## 2016-08-18 DIAGNOSIS — E7800 Pure hypercholesterolemia, unspecified: Secondary | ICD-10-CM | POA: Diagnosis not present

## 2016-08-18 LAB — LIPID PANEL
Cholesterol: 154 mg/dL (ref 0–200)
HDL: 52.6 mg/dL (ref >39.00)
LDL Cholesterol: 70 mg/dL (ref 0–99)
NONHDL: 101.58
Total CHOL/HDL Ratio: 3
Triglycerides: 157 mg/dL — ABNORMAL HIGH (ref 0.0–149.0)
VLDL: 31.4 mg/dL (ref 0.0–40.0)

## 2016-08-18 LAB — COMPREHENSIVE METABOLIC PANEL
ALT: 13 U/L (ref 0–53)
AST: 11 U/L (ref 0–37)
Albumin: 4.5 g/dL (ref 3.5–5.2)
Alkaline Phosphatase: 31 U/L — ABNORMAL LOW (ref 39–117)
BUN: 16 mg/dL (ref 6–23)
CALCIUM: 9.7 mg/dL (ref 8.4–10.5)
CHLORIDE: 104 meq/L (ref 96–112)
CO2: 30 meq/L (ref 19–32)
CREATININE: 1.05 mg/dL (ref 0.40–1.50)
GFR: 76.45 mL/min (ref >60.00)
Glucose, Bld: 119 mg/dL — ABNORMAL HIGH (ref 70–99)
POTASSIUM: 4.9 meq/L (ref 3.5–5.1)
SODIUM: 140 meq/L (ref 135–145)
Total Bilirubin: 0.8 mg/dL (ref 0.2–1.2)
Total Protein: 7 g/dL (ref 6.0–8.3)

## 2016-08-18 LAB — HEMOGLOBIN A1C: HEMOGLOBIN A1C: 6.6 % — AB (ref 4.6–6.5)

## 2016-08-18 MED ORDER — SAXAGLIPTIN-METFORMIN ER 2.5-1000 MG PO TB24: 2.0000 | ORAL_TABLET | Freq: Every day | ORAL | 1 refills | Status: DC

## 2016-08-18 MED ORDER — RAMIPRIL 2.5 MG PO CAPS: 2.5000 mg | ORAL_CAPSULE | Freq: Every day | ORAL | 3 refills | Status: AC

## 2016-08-18 MED ORDER — OSELTAMIVIR PHOSPHATE 75 MG PO CAPS: 75.0000 mg | ORAL_CAPSULE | Freq: Two times a day (BID) | ORAL | 0 refills | Status: AC

## 2016-08-18 MED ORDER — SIMVASTATIN 40 MG PO TABS: 40.0000 mg | ORAL_TABLET | Freq: Every day | ORAL | 3 refills | Status: DC

## 2016-08-18 MED ORDER — PIOGLITAZONE HCL 30 MG PO TABS: 30.0000 mg | ORAL_TABLET | Freq: Every day | ORAL | 1 refills | Status: DC

## 2016-08-18 NOTE — Progress Notes (Signed)
Pre visit review using our clinic review tool, if applicable. No additional management support is needed unless otherwise documented below in the visit note. 

## 2016-08-18 NOTE — Progress Notes (Signed)
Office Note 08/18/2016  CC:  Chief Complaint  Patient presents with  . Follow-up    RCI, pt is fasting.    HPI:  Glenn Arnold is a 60 y.o. White male who is here f/u DM 2, HLD, CRI stage II. No diab diet. No home glucose monitoring. Takes meds every day.  No home bp monitoring.   Takes simvastatin daily w/out side effect.  Feet: no tingling, burning or numbness.  ROS: no CP, no HA, no SOB, no dizziness, no LE swelling, no cough.  Past Medical History:  Diagnosis Date  . Cervical stenosis of spinal canal    Dr Joya Salm  . Chronic constipation   . Chronic renal insufficiency, stage II (mild)    CrCl in 60s-70s  . CTS (carpal tunnel syndrome) 2013   RUE > LUE  . Diabetes mellitus   . High cholesterol   . History of adenomatous polyp of colon     Past Surgical History:  Procedure Laterality Date  . cervical myelogram  2013  . COLONOSCOPY  Approx 2006; 09/2014   Pt not sure of timing; says it was done in [place] and it was normal.  Repeat 09/2014--two adenomatous polyps in ascending colon--recall 5 yrs.  . DENTAL SURGERY     implants ([employer])  . HAND SURGERY Left    post fracture   . microscopic cervical disc surgery  02/28/2012   Dr Audelia Hives , [place]    Family History  Problem Relation Age of Onset  . Heart disease Father     congenital; S/P CBAG & valve replacement  . Diabetes Father   . Stroke Paternal Grandfather 51  . Diabetes Mother   . Breast cancer Sister     Social History   Social History  . Marital status: Married    Spouse name: N/A  . Number of children: 1  . Years of education: N/A   Occupational History  . roto [employer]   Social History Main Topics  . Smoking status: Former Smoker    Types: Cigarettes    Quit date: 09/06/1974  . Smokeless tobacco: Never Used  . Alcohol use No  . Drug use: No  . Sexual activity: Not on file   Other Topics Concern  . Not on file   Social History Narrative   Married, 1 adult  son.   Lives in [place].   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   No T/A/Ds.    Outpatient Medications Prior to Visit  Medication Sig Dispense Refill  . aspirin 81 MG tablet Take 81 mg by mouth daily.      Marland Kitchen glucose blood (ONE TOUCH ULTRA TEST) test strip Use to check blood sugar once a day dx code E11.65 50 each 3  . ONETOUCH DELICA LANCETS MISC by Does not apply route. Check blood sugar once daily     . pioglitazone (ACTOS) 30 MG tablet TAKE ONE TABLET BY MOUTH EVERY DAY 90 tablet 0  . ramipril (ALTACE) 2.5 MG capsule TAKE 1 CAPSULE BY MOUTH DAILY 30 capsule 0  . Saxagliptin-Metformin (KOMBIGLYZE XR) 2.01-999 MG TB24 Take 2 tablets by mouth daily. 180 tablet 1  . simvastatin (ZOCOR) 40 MG tablet TAKE ONE TABLET BY MOUTH AT BEDTIME 90 tablet 0  . LINZESS 145 MCG CAPS capsule TAKE 1 CAPSULE BY MOUTH DAILY (Patient not taking: Reported on 08/18/2016) 30 capsule 3  . ramipril (ALTACE) 2.5 MG capsule TAKE 1 CAPSULE BY MOUTH DAILY (Patient not taking: Reported on 08/18/2016)  90 capsule 0   No facility-administered medications prior to visit.     Allergies  Allergen Reactions  . Augmentin [Amoxicillin-Pot Clavulanate] Other (See Comments)    Info from old records ([facility] 12/04/14)    PE; Blood pressure 120/83, pulse 69, temperature 98.1 F (36.7 C), temperature source Oral, resp. rate 16, height 6\' 1"  (1.854 m), weight 245 lb 4 oz (111.2 kg), SpO2 98 %. Gen: Alert, well appearing.  Patient is oriented to person, place, time, and situation. CV: RRR, no m/r/g.   LUNGS: CTA bilat, nonlabored resps, good aeration in all lung fields. EXT: no clubbing, cyanosis, or edema.  Foot exam - bilateral normal; no swelling, tenderness or skin or vascular lesions. Color and temperature is normal. Sensation is intact. Peripheral pulses are palpable. Toenails are normal.  Pertinent labs:  Lab Results  Component Value Date   TSH 1.45 05/31/2013   Lab Results  Component Value Date   WBC 7.8  06/17/2014   HGB 15.6 06/17/2014   HCT 47.0 06/17/2014   MCV 86.6 06/17/2014   PLT 296.0 06/17/2014   Lab Results  Component Value Date   CREATININE 1.08 08/28/2015   BUN 18 08/28/2015   NA 140 08/28/2015   K 4.5 08/28/2015   CL 102 08/28/2015   CO2 29 08/28/2015   Lab Results  Component Value Date   ALT 13 08/28/2015   AST 13 08/28/2015   ALKPHOS 36 (L) 08/28/2015   BILITOT 0.7 08/28/2015   Lab Results  Component Value Date   CHOL 139 01/08/2015   Lab Results  Component Value Date   HDL 36.90 (L) 01/08/2015   Lab Results  Component Value Date   LDLCALC 98 05/31/2013   Lab Results  Component Value Date   TRIG 248.0 (H) 01/08/2015   Lab Results  Component Value Date   CHOLHDL 4 01/08/2015   Lab Results  Component Value Date   PSA 0.51 08/28/2015   PSA 0.44 11/27/2008   Lab Results  Component Value Date   HGBA1C 6.6 (H) 08/28/2015    ASSESSMENT AND PLAN:   1) DM 2, good control historically. HbA1c today. Feet exam normal today.  2) HTN: The current medical regimen is effective;  continue present plan and medications. Lytes/cr today.  3) Hyperlipidemia: tolerating statin.  Check FLP today.  4) CRI stage II: lytes/cr today.  5) Screening for Hep C: pt desired this lab today.  At end of visit today, pt asked for tamiflu rx, stating that in the past he used this med the instant he started getting flu-like sx's and it "knocked it right out".  We reviewed the typical symptoms of flu, and I did go ahead and print him a rx for Tamiflu but encouraged him to come to office if symptoms not clear or if prolonged/severe.  An After Visit Summary was printed and given to the patient.   FOLLOW UP:  Return in about 6 months (around 02/16/2017) for routine chronic illness f/u.  Signed:  Crissie Sickles, MD           08/18/2016

## 2016-08-19 LAB — HEPATITIS C ANTIBODY: HCV AB: NEGATIVE

## 2016-11-24 ENCOUNTER — Telehealth: Payer: Self-pay | Admitting: Family Medicine

## 2016-11-24 MED ORDER — SAXAGLIPTIN-METFORMIN ER 2.5-1000 MG PO TB24: 2.0000 | ORAL_TABLET | Freq: Every day | ORAL | 1 refills | Status: AC

## 2016-11-24 NOTE — Telephone Encounter (Signed)
SW pt and advised him that he can pick up the Rx or we can send it to his pharmacy of choice but we can not email Rx's. Pt voiced understanding and requested to p/u Rx.   RF request for kombiglyze LOV: 08/18/16 Next ov: None Last written: 08/18/16 #180 w/ 1RF

## 2016-11-24 NOTE — Telephone Encounter (Signed)
Pt called and is requesting that he have his RX for Kombiglyze be sent to him via email, told pt this would have to forwarded to clinical staff, please advise

## 2017-03-03 ENCOUNTER — Other Ambulatory Visit: Payer: Self-pay | Admitting: Family Medicine

## 2017-03-04 NOTE — Telephone Encounter (Signed)
[facility]  RF request for pioglitazone LOV: 08/18/16 Next ov: None Last written: 08/18/16 #90 w/ 1RF  RF request for kombiglyze Last written: 08/18/16 #90 w/ 1RF  Will send Rx for #90 w/ 0Rf. Pt needs office visit for more refills.

## 2017-03-10 NOTE — Telephone Encounter (Signed)
MyChart message was read

## 2017-05-18 ENCOUNTER — Other Ambulatory Visit: Payer: Self-pay | Admitting: Family Medicine

## 2017-05-18 NOTE — Telephone Encounter (Signed)
MyChart message read.

## 2017-09-13 ENCOUNTER — Other Ambulatory Visit: Payer: Self-pay | Admitting: Family Medicine

## 2017-09-16 NOTE — Telephone Encounter (Signed)
SW pt and he stated that he has a new PCP. Chart updated.

## 2019-08-28 ENCOUNTER — Encounter: Payer: Self-pay | Admitting: *Deleted

## 2019-09-17 NOTE — Telephone Encounter (Signed)
Error
